# Patient Record
Sex: Female | Born: 1990 | Race: Black or African American | Hispanic: No | Marital: Single
Health system: Midwestern US, Community
[De-identification: ages and names within clinical notes are randomized; demographics above are authoritative.]

## PROBLEM LIST (undated history)

## (undated) DIAGNOSIS — N83209 Unspecified ovarian cyst, unspecified side: Secondary | ICD-10-CM

## (undated) HISTORY — PX: WISDOM TOOTH EXTRACTION: SHX21

---

## 2013-08-28 LAB — URINE MICROSCOPIC ONLY: WBC: 4 /hpf (ref 0–4)

## 2013-08-28 LAB — URINALYSIS W/ RFLX MICROSCOPIC
Bilirubin: NEGATIVE
Glucose: NEGATIVE mg/dL
Ketone: NEGATIVE mg/dL
Nitrites: NEGATIVE
Protein: NEGATIVE mg/dL
Specific gravity: 1.015 (ref 1.003–1.030)
Urobilinogen: 0.2 EU/dL (ref 0.2–1.0)
pH (UA): 6.5 (ref 5.0–8.0)

## 2013-08-28 LAB — HCG URINE, QL: HCG urine, QL: NEGATIVE

## 2013-08-28 MED ORDER — PHENAZOPYRIDINE 200 MG TAB
200 mg | ORAL_TABLET | Freq: Three times a day (TID) | ORAL | 0.00 refills | 5.00 days | Status: AC
Start: 2013-08-28 — End: 2013-08-30

## 2013-08-28 MED ORDER — TRIMETHOPRIM-SULFAMETHOXAZOLE 160 MG-800 MG TAB
160-800 mg | ORAL_TABLET | Freq: Two times a day (BID) | ORAL | Status: AC
Start: 2013-08-28 — End: 2013-08-31

## 2013-08-28 NOTE — ED Notes (Signed)
Patient armband removed and shredded I have reviewed discharge instructions with the patient.  The patient verbalized understanding.

## 2013-08-28 NOTE — ED Notes (Signed)
Lower abdominal pain with urinary frequency, pain and odor to urine

## 2013-08-28 NOTE — ED Provider Notes (Signed)
HPI Comments: Madeline Murray is a [age] y.o. female who presents to the emergency department for evaluation of possible UTI. Pt states she has had lower abdominal pain, urinary frequency and dysuria for 3 days. Pt also had chills last night. Pt states symptoms feel similar to UTI she had 4 years ago. Pt denies fever, SOB, chest pain, NVD, headache and dizziness. Pt expresses no other complaints at this time.     The history is provided by the patient.        History reviewed. No pertinent past medical history.     History reviewed. No pertinent past surgical history.      History reviewed. No pertinent family history.     History     Social History   ??? Marital Status: SINGLE     Spouse Name: N/A     Number of Children: N/A   ??? Years of Education: N/A     Occupational History   ??? Not on file.     Social History Main Topics   ??? Smoking status: Never Smoker    ??? Smokeless tobacco: Not on file   ??? Alcohol Use: Not on file   ??? Drug Use: Not on file   ??? Sexual Activity: Not on file     Other Topics Concern   ??? Not on file     Social History Narrative   ??? No narrative on file                  ALLERGIES: Review of patient's allergies indicates no known allergies.      Review of Systems   Constitutional: Positive for chills. Negative for fever.   HENT: Negative for congestion and sore throat.    Eyes: Negative for redness and visual disturbance.   Respiratory: Negative for shortness of breath.    Cardiovascular: Negative for chest pain.   Gastrointestinal: Positive for abdominal pain. Negative for nausea, vomiting and diarrhea.   Genitourinary: Positive for dysuria and frequency. Negative for difficulty urinating.   Musculoskeletal: Negative for myalgias and arthralgias.   Skin: Negative for rash.   Neurological: Negative for dizziness and headaches.   Psychiatric/Behavioral: Negative for dysphoric mood.   All other systems reviewed and are negative.      Filed Vitals:    08/28/13 1329   BP: 127/75   Pulse: 87   Temp: 98.4 ??F  (36.9 ??C)   Resp: 17   Height: 5\' 6"  (1.676 m)   Weight: 61.236 kg (135 lb)   SpO2: 100%            Physical Exam   Constitutional: She is oriented to person, place, and time. She appears well-developed and well-nourished. No distress.   HENT:   Head: Normocephalic and atraumatic.   Nose: Nose normal.   Cardiovascular: Normal rate, regular rhythm and normal heart sounds.  Exam reveals no gallop and no friction rub.    No murmur heard.  Pulmonary/Chest: Effort normal and breath sounds normal. No respiratory distress. She has no wheezes. She has no rales.   Abdominal: Soft. She exhibits no mass. There is tenderness in the suprapubic area. There is no rebound and no guarding.   Mild bilateral flank tenderness to percussion. No RUQ or RLQ tenderness.    Musculoskeletal: She exhibits no edema or tenderness.   Neurological: She is alert and oriented to person, place, and time. She exhibits normal muscle tone. Coordination normal.   Skin: Skin is warm and dry. No  rash noted. She is not diaphoretic.   Psychiatric: She has a normal mood and affect.   Nursing note and vitals reviewed.       MDM  Number of Diagnoses or Management Options  Microscopic hematuria:   UTI (urinary tract infection):   Diagnosis management comments: IMP: Pt presents with UTI sxs. Has microscopic hematuria, neg pregnancy. No localizing flank/abdominal pain to suggest rebnal colic at this time. Will treat for hem cystitis and give f/u referral.      Procedures    -------------------------------------------------------------------------------------------------------------------  PROGRESS NOTES:  1:26 PM: Karma Ganja, MD is evaluating the patient. Discussed treatment plan with the patient.  2:30 PM: Karma Ganja, MD reviewed lab results with the parent. Discussed diagnosis, prescriptions and follow up instructions with the parent. Answered parent's questions. Patient is stable and ready for discharge.       CONSULTATIONS:  None    ORDERS:  Orders  Placed This Encounter   ??? URINALYSIS W/ RFLX MICROSCOPIC   ??? HCG URINE, QL   ??? URINE MICROSCOPIC ONLY   ??? trimethoprim-sulfamethoxazole (BACTRIM DS) 160-800 mg per tablet   ??? phenazopyridine (PYRIDIUM) 200 mg tablet       MEDICATIONS:  Medications - No data to display     RADIOLOGY RESULTS:  No orders to display       LAB & EKG RESULTS:   Recent Results (from the past 12 hour(s))   URINALYSIS W/ RFLX MICROSCOPIC    Collection Time     08/28/13  1:30 PM       Result Value Ref Range    Color YELLOW      Appearance CLEAR      Specific gravity 1.015  1.003 - 1.030      pH (UA) 6.5  5.0 - 8.0      Protein NEGATIVE   NEG mg/dL    Glucose NEGATIVE   NEG mg/dL    Ketone NEGATIVE   NEG mg/dL    Bilirubin NEGATIVE   NEG      Blood LARGE (*) NEG      Urobilinogen 0.2  0.2 - 1.0 EU/dL    Nitrites NEGATIVE   NEG      Leukocyte Esterase TRACE (*) NEG     HCG URINE, QL    Collection Time     08/28/13  1:30 PM       Result Value Ref Range    HCG urine, Ql. NEGATIVE   NEG          DISPOSITION:   Diagnosis:   1. UTI (urinary tract infection)    2. Microscopic hematuria          Disposition: home    Follow-up Information    Follow up With Details Comments Contact Info    Phillis Knack, MD In 1 week As needed [address]  [address] [place] [address]  [phone]            Current Discharge Medication List      START taking these medications    Details   trimethoprim-sulfamethoxazole (BACTRIM DS) 160-800 mg per tablet Take 1 Tab by mouth two (2) times a day for 3 days.  Qty: 6 Tab, Refills: 0      phenazopyridine (PYRIDIUM) 200 mg tablet Take 1 Tab by mouth three (3) times daily for 2 days.  Qty: 6 Tab, Refills: 0  Teacher, early years/pre written by: Donell Beers (1:27 PM), scribing for and in the presence of Karma Ganja, MD, ED Provider.    PROVIDER ATTESTATION STATEMENT   I personally performed the services described in the documentation, reviewed the  documentation, as recorded by the scribe in my presence, and it accurately and completely records my words and actions.   Karma Ganja, MD. 2:35 PM    -------------------------------------------------------------------------------------------------------------------

## 2013-08-28 NOTE — ED Provider Notes (Signed)
 Formatting of this note is different from the original.  HPI Comments: Madeline Murray is a 23 y.o. female who presents to the emergency department for evaluation of possible UTI. Pt states she has had lower abdominal pain, urinary frequency and dysuria for 3 days. Pt also had chills last night. Pt states symptoms feel similar to UTI she had 4 years ago. Pt denies fever, SOB, chest pain, NVD, headache and dizziness. Pt expresses no other complaints at this time.     The history is provided by the patient.       History reviewed. No pertinent past medical history.     History reviewed. No pertinent past surgical history.      History reviewed. No pertinent family history.     History     Social History   ? Marital Status: SINGLE     Spouse Name: N/A     Number of Children: N/A   ? Years of Education: N/A     Occupational History   ? Not on file.     Social History Main Topics   ? Smoking status: Never Smoker    ? Smokeless tobacco: Not on file   ? Alcohol Use: Not on file   ? Drug Use: Not on file   ? Sexual Activity: Not on file     Other Topics Concern   ? Not on file     Social History Narrative   ? No narrative on file         ALLERGIES: Review of patient's allergies indicates no known allergies.    Review of Systems   Constitutional: Positive for chills. Negative for fever.   HENT: Negative for congestion and sore throat.    Eyes: Negative for redness and visual disturbance.   Respiratory: Negative for shortness of breath.    Cardiovascular: Negative for chest pain.   Gastrointestinal: Positive for abdominal pain. Negative for nausea, vomiting and diarrhea.   Genitourinary: Positive for dysuria and frequency. Negative for difficulty urinating.   Musculoskeletal: Negative for myalgias and arthralgias.   Skin: Negative for rash.   Neurological: Negative for dizziness and headaches.   Psychiatric/Behavioral: Negative for dysphoric mood.   All other systems reviewed and are negative.    Filed Vitals:    08/28/13  1329   BP: 127/75   Pulse: 87   Temp: 98.4 F (36.9 C)   Resp: 17   Height: 5' 6 (1.676 m)   Weight: 61.236 kg (135 lb)   SpO2: 100%       Physical Exam   Constitutional: She is oriented to person, place, and time. She appears well-developed and well-nourished. No distress.   HENT:   Head: Normocephalic and atraumatic.   Nose: Nose normal.   Cardiovascular: Normal rate, regular rhythm and normal heart sounds.  Exam reveals no gallop and no friction rub.    No murmur heard.  Pulmonary/Chest: Effort normal and breath sounds normal. No respiratory distress. She has no wheezes. She has no rales.   Abdominal: Soft. She exhibits no mass. There is tenderness in the suprapubic area. There is no rebound and no guarding.   Mild bilateral flank tenderness to percussion. No RUQ or RLQ tenderness.    Musculoskeletal: She exhibits no edema or tenderness.   Neurological: She is alert and oriented to person, place, and time. She exhibits normal muscle tone. Coordination normal.   Skin: Skin is warm and dry. No rash noted. She is not diaphoretic.   Psychiatric:  She has a normal mood and affect.   Nursing note and vitals reviewed.      MDM  Number of Diagnoses or Management Options  Microscopic hematuria:   UTI (urinary tract infection):   Diagnosis management comments: IMP: Pt presents with UTI sxs. Has microscopic hematuria, neg pregnancy. No localizing flank/abdominal pain to suggest rebnal colic at this time. Will treat for hem cystitis and give f/u referral.    Procedures    -------------------------------------------------------------------------------------------------------------------  PROGRESS NOTES:  1:26 PM: Lamar DELENA Freud, MD is evaluating the patient. Discussed treatment plan with the patient.  2:30 PM: Lamar DELENA Freud, MD reviewed lab results with the parent. Discussed diagnosis, prescriptions and follow up instructions with the parent. Answered parent's questions. Patient is stable and ready for discharge.        CONSULTATIONS:  None    ORDERS:  Orders Placed This Encounter   ? URINALYSIS W/ RFLX MICROSCOPIC   ? HCG URINE, QL   ? URINE MICROSCOPIC ONLY   ? trimethoprim-sulfamethoxazole (BACTRIM DS) 160-800 mg per tablet   ? phenazopyridine (PYRIDIUM) 200 mg tablet     MEDICATIONS:  Medications - No data to display     RADIOLOGY RESULTS:  No orders to display     LAB & EKG RESULTS:   Recent Results (from the past 12 hour(s))   URINALYSIS W/ RFLX MICROSCOPIC    Collection Time     08/28/13  1:30 PM       Result Value Ref Range    Color YELLOW      Appearance CLEAR      Specific gravity 1.015  1.003 - 1.030      pH (UA) 6.5  5.0 - 8.0      Protein NEGATIVE   NEG mg/dL    Glucose NEGATIVE   NEG mg/dL    Ketone NEGATIVE   NEG mg/dL    Bilirubin NEGATIVE   NEG      Blood LARGE (*) NEG      Urobilinogen 0.2  0.2 - 1.0 EU/dL    Nitrites NEGATIVE   NEG      Leukocyte Esterase TRACE (*) NEG     HCG URINE, QL    Collection Time     08/28/13  1:30 PM       Result Value Ref Range    HCG urine, Ql. NEGATIVE   NEG         DISPOSITION:   Diagnosis:   1. UTI (urinary tract infection)    2. Microscopic hematuria      Disposition: home    Follow-up Information    Follow up With Details Comments Contact Info    Jenetta JONELLE Roller, MD In 1 week As needed [address]  [address] [place] [address]  [phone]        Current Discharge Medication List     START taking these medications    Details   trimethoprim-sulfamethoxazole (BACTRIM DS) 160-800 mg per tablet Take 1 Tab by mouth two (2) times a day for 3 days.  Qty: 6 Tab, Refills: 0     phenazopyridine (PYRIDIUM) 200 mg tablet Take 1 Tab by mouth three (3) times daily for 2 days.  Qty: 6 Tab, Refills: 0           Teacher, early years/pre written by: Cassandra Dalmida (1:27 PM), scribing for and in the presence of Lamar DELENA Freud, MD, ED Provider.  PROVIDER ATTESTATION STATEMENT   I personally performed the services described in the  documentation, reviewed the documentation, as recorded by the scribe in my presence, and it accurately and completely records my words and actions.   Lamar DELENA Freud, MD. 2:35 PM    -------------------------------------------------------------------------------------------------------------------   Electronically signed by Freud Lamar DELENA, MD at 08/28/2013  2:35 PM EDT

## 2013-08-28 NOTE — ED Notes (Signed)
 Formatting of this note might be different from the original.  Lower abdominal pain with urinary frequency, pain and odor to urine  Electronically signed by Trena Kate SQUIBB, RN at 08/28/2013  1:28 PM EDT

## 2013-08-28 NOTE — ED Notes (Signed)
 Formatting of this note might be different from the original.  Patient armband removed and shredded I have reviewed discharge instructions with the patient.  The patient verbalized understanding.  Electronically signed by Wadie Andree GAILS, RN at 08/28/2013  2:47 PM EDT

## 2017-03-31 ENCOUNTER — Emergency Department (HOSPITAL_COMMUNITY)
Admission: EM | Admit: 2017-03-31 | Discharge: 2017-03-31 | Disposition: A | Discharge status: Home / Self Care | Payer: Self-pay | Attending: Emergency Medicine | Admitting: Emergency Medicine

## 2017-03-31 ENCOUNTER — Encounter (HOSPITAL_COMMUNITY): Payer: Self-pay | Admitting: Emergency Medicine

## 2017-03-31 DIAGNOSIS — B9689 Other specified bacterial agents as the cause of diseases classified elsewhere: Secondary | ICD-10-CM | POA: Insufficient documentation

## 2017-03-31 DIAGNOSIS — N760 Acute vaginitis: Secondary | ICD-10-CM | POA: Insufficient documentation

## 2017-03-31 DIAGNOSIS — N72 Inflammatory disease of cervix uteri: Secondary | ICD-10-CM | POA: Insufficient documentation

## 2017-03-31 LAB — URINALYSIS, ROUTINE W REFLEX MICROSCOPIC
Bilirubin Urine: NEGATIVE
Glucose, UA: NEGATIVE mg/dL
HGB URINE DIPSTICK: NEGATIVE
Ketones, ur: NEGATIVE mg/dL
LEUKOCYTES UA: NEGATIVE
Nitrite: NEGATIVE
PROTEIN: NEGATIVE mg/dL
Specific Gravity, Urine: 1.025 (ref 1.005–1.030)
pH: 7 (ref 5.0–8.0)

## 2017-03-31 LAB — BASIC METABOLIC PANEL
Anion gap: 7 (ref 5–15)
BUN: 12 mg/dL (ref 6–20)
CALCIUM: 9.2 mg/dL (ref 8.9–10.3)
CO2: 26 mmol/L (ref 22–32)
CREATININE: 0.95 mg/dL (ref 0.44–1.00)
Chloride: 105 mmol/L (ref 101–111)
GFR calc non Af Amer: >60 mL/min (ref >60)
Glucose, Bld: 101 mg/dL — ABNORMAL HIGH (ref 65–99)
Potassium: 4.1 mmol/L (ref 3.5–5.1)
Sodium: 138 mmol/L (ref 135–145)

## 2017-03-31 LAB — CBC
HCT: 39.5 % (ref 36.0–46.0)
Hemoglobin: 13.7 g/dL (ref 12.0–15.0)
MCH: 30.2 pg (ref 26.0–34.0)
MCHC: 34.7 g/dL (ref 30.0–36.0)
MCV: 87.2 fL (ref 78.0–100.0)
PLATELETS: 314 10*3/uL (ref 150–400)
RBC: 4.53 MIL/uL (ref 3.87–5.11)
RDW: 12.3 % (ref 11.5–15.5)
WBC: 6 10*3/uL (ref 4.0–10.5)

## 2017-03-31 LAB — RPR: RPR: NONREACTIVE

## 2017-03-31 LAB — HIV ANTIBODY (ROUTINE TESTING W REFLEX): HIV Screen 4th Generation wRfx: NONREACTIVE

## 2017-03-31 LAB — I-STAT BETA HCG BLOOD, ED (MC, WL, AP ONLY)

## 2017-03-31 LAB — WET PREP, GENITAL
Sperm: NONE SEEN
TRICH WET PREP: NONE SEEN
Yeast Wet Prep HPF POC: NONE SEEN

## 2017-03-31 MED ORDER — DOXYCYCLINE HYCLATE 100 MG PO CAPS
100.0000 mg | ORAL_CAPSULE | Freq: Two times a day (BID) | ORAL | 0 refills | Status: AC
Start: 2017-03-31 — End: 2017-04-14

## 2017-03-31 MED ORDER — CEFTRIAXONE SODIUM 250 MG IJ SOLR
250.0000 mg | Freq: Once | INTRAMUSCULAR | Status: DC
  Filled 2017-03-31: qty 250

## 2017-03-31 MED ORDER — MORPHINE SULFATE (PF) 4 MG/ML IV SOLN: 4.0000 mg | Freq: Once | INTRAVENOUS | Status: DC

## 2017-03-31 MED ORDER — AZITHROMYCIN 250 MG PO TABS
1000.0000 mg | ORAL_TABLET | Freq: Once | ORAL | Status: DC
  Filled 2017-03-31: qty 4

## 2017-03-31 MED ORDER — ACETAMINOPHEN 500 MG PO TABS
1000.0000 mg | ORAL_TABLET | Freq: Once | ORAL | Status: DC
  Filled 2017-03-31: qty 2

## 2017-03-31 MED ORDER — IBUPROFEN 800 MG PO TABS
800.0000 mg | ORAL_TABLET | Freq: Once | ORAL | Status: DC
  Filled 2017-03-31: qty 1

## 2017-03-31 MED ORDER — METRONIDAZOLE 500 MG PO TABS: 500.0000 mg | ORAL_TABLET | Freq: Two times a day (BID) | ORAL | 0 refills | Status: DC

## 2017-03-31 MED ORDER — ONDANSETRON HCL 4 MG/2ML IJ SOLN: 4.0000 mg | Freq: Once | INTRAMUSCULAR | Status: DC

## 2017-03-31 MED ORDER — LIDOCAINE HCL (PF) 1 % IJ SOLN
INTRAMUSCULAR | Status: AC
  Filled 2017-03-31: qty 5

## 2017-03-31 NOTE — ED Provider Notes (Signed)
[facility]-EMERGENCY DEPT Provider Note   CSN: [id number] Arrival date & time: 03/31/17  4098     History   Chief Complaint Chief Complaint  Patient presents with  . Abdominal Pain    HPI Paige Mccarty is a 26 y.o. female.  26 yo F with a chief complaint of left lower abdominal pain. The started this morning. As she is waiting in triage she noticed a lump. Worse with movement palpation describes the pain is sharp. Denies nausea vomiting or diarrhea. Denies sick contacts. Denies vaginal bleeding or discharge. Denies trauma. Denies radiation of the pain. Denies prior abdominal surgery.   The history is provided by the patient.  Abdominal Pain   This is a new problem. The current episode started 6 to 12 hours ago. The problem occurs constantly. The problem has not changed since onset.The pain is located in the LLQ. The quality of the pain is sharp and shooting. The pain is at a severity of 4/10. The pain is severe. Pertinent negatives include fever, nausea, vomiting, dysuria, headaches, arthralgias and myalgias. The symptoms are aggravated by certain positions and palpation. Nothing relieves the symptoms.    History reviewed. No pertinent past medical history.  There are no active problems to display for this patient.   History reviewed. No pertinent surgical history.  OB History    No data available       Home Medications    Prior to Admission medications   Medication Sig Start Date End Date Taking? Authorizing Provider  naproxen sodium (ANAPROX) 220 MG tablet Take 220 mg by mouth once.   Yes [provider]  doxycycline (VIBRAMYCIN) 100 MG capsule Take 1 capsule (100 mg total) by mouth 2 (two) times daily. 03/31/17 04/14/17  Melene Plan, DO  metroNIDAZOLE (FLAGYL) 500 MG tablet Take 1 tablet (500 mg total) by mouth 2 (two) times daily. 03/31/17   Melene Plan, DO    Family History No family history on file.  Social History Social History  Substance Use  Topics  . Smoking status: Never Smoker  . Smokeless tobacco: Never Used  . Alcohol use Not on file     Allergies   Patient has no known allergies.   Review of Systems Review of Systems  Constitutional: Negative for chills and fever.  HENT: Negative for congestion and rhinorrhea.   Eyes: Negative for redness and visual disturbance.  Respiratory: Negative for shortness of breath and wheezing.   Cardiovascular: Negative for chest pain and palpitations.  Gastrointestinal: Positive for abdominal pain. Negative for nausea and vomiting.  Genitourinary: Negative for dysuria and urgency.  Musculoskeletal: Negative for arthralgias and myalgias.  Skin: Negative for pallor and wound.  Neurological: Negative for dizziness and headaches.     Physical Exam Updated Vital Signs BP 116/64 (BP Location: Right Arm)   Pulse 90   Temp 98.8 F (37.1 C) (Oral)   Resp 16   Ht  (1.6 m)   Wt 60.8 kg (134 lb)   LMP 03/17/2017 (Approximate)   SpO2 100%   BMI 23.74 kg/m   Physical Exam  Constitutional: She is oriented to person, place, and time. She appears well-developed and well-nourished. No distress.  HENT:  Head: Normocephalic and atraumatic.  Eyes: Pupils are equal, round, and reactive to light. EOM are normal.  Neck: Normal range of motion. Neck supple.  Cardiovascular: Normal rate and regular rhythm.  Exam reveals no gallop and no friction rub.   No murmur heard. Pulmonary/Chest: Effort normal. She has  no wheezes. She has no rales.  Abdominal: Soft. She exhibits no distension and no mass. There is generalized tenderness and tenderness in the suprapubic area. There is no guarding.  Small punctate area of tenderness just left of midline in the suprapubic region  Genitourinary: Cervix exhibits motion tenderness, discharge and friability. Right adnexum displays no mass, no tenderness and no fullness. Left adnexum displays no mass, no tenderness and no fullness.  Musculoskeletal: She  exhibits no edema or tenderness.  Neurological: She is alert and oriented to person, place, and time.  Skin: Skin is warm and dry. She is not diaphoretic.  Psychiatric: Her behavior is normal. Her mood appears anxious.  Nursing note and vitals reviewed.    ED Treatments / Results  Labs (all labs ordered are listed, but only abnormal results are displayed) Labs Reviewed  WET PREP, GENITAL - Abnormal; Notable for the following:       Result Value   Clue Cells Wet Prep HPF POC PRESENT (*)    WBC, Wet Prep HPF POC MANY (*)    All other components within normal limits  BASIC METABOLIC PANEL - Abnormal; Notable for the following:    Glucose, Bld 101 (*)    All other components within normal limits  URINALYSIS, ROUTINE W REFLEX MICROSCOPIC  CBC  RPR  HIV ANTIBODY (ROUTINE TESTING)  I-STAT BETA HCG BLOOD, ED ([facility], [facility], [facility] ONLY)  GC/CHLAMYDIA PROBE AMP ([place]) NOT AT [facility]    EKG  EKG Interpretation None       Radiology No results found.  Procedures Procedures (including critical care time)  Medications Ordered in ED Medications  cefTRIAXone (ROCEPHIN) injection 250 mg (250 mg Intramuscular Refused 03/31/17 1107)  azithromycin (ZITHROMAX) tablet 1,000 mg (1,000 mg Oral Refused 03/31/17 1107)  acetaminophen (TYLENOL) tablet 1,000 mg (1,000 mg Oral Refused 03/31/17 1108)  ibuprofen (ADVIL,MOTRIN) tablet 800 mg (800 mg Oral Refused 03/31/17 1108)     Initial Impression / Assessment and Plan / ED Course  I have reviewed the triage vital signs and the nursing notes.  Pertinent labs & imaging results that were available during my care of the patient were reviewed by me and considered in my medical decision making (see chart for details).     26 yo F With a chief complaint of lower abdominal pain. Just started this morning. Patient complaining of a lump that is almost like a lymph node inconsistency though more midline. Pelvic with significant CMT and drainage.  No adnexal  TTP.   Will treat for PID.  Awaiting wet prep.   The patient is a bit hesitant to start her therapy prior to having a confirmed diagnosis. I discussed the limitations of the oral therapy and that it would be preferred be treated with IM medication. She understands this risk and the risk that she may become infertile or obtain an abscess for an untreated infection. I'll have her follow-up with the family physician.  11:38 AM:  I have discussed the diagnosis/risks/treatment options with the patient and believe the pt to be eligible for discharge home to follow-up with PCP, GYN. We also discussed returning to the ED immediately if new or worsening sx occur. We discussed the sx which are most concerning (e.g., sudden worsening pain, fever, inability to tolerate by mouth) that necessitate immediate return. Medications administered to the patient during their visit and any new prescriptions provided to the patient are listed below.  Medications given during this visit Medications  cefTRIAXone (ROCEPHIN) injection 250  mg (250 mg Intramuscular Refused 03/31/17 1107)  azithromycin (ZITHROMAX) tablet 1,000 mg (1,000 mg Oral Refused 03/31/17 1107)  acetaminophen (TYLENOL) tablet 1,000 mg (1,000 mg Oral Refused 03/31/17 1108)  ibuprofen (ADVIL,MOTRIN) tablet 800 mg (800 mg Oral Refused 03/31/17 1108)     The patient appears reasonably screen and/or stabilized for discharge and I doubt any other medical condition or other [facility] requiring further screening, evaluation, or treatment in the ED at this time prior to discharge.    Final Clinical Impressions(s) / ED Diagnoses   Final diagnoses:  BV (bacterial vaginosis)  Cervicitis    New Prescriptions New Prescriptions   DOXYCYCLINE (VIBRAMYCIN) 100 MG CAPSULE    Take 1 capsule (100 mg total) by mouth 2 (two) times daily.   METRONIDAZOLE (FLAGYL) 500 MG TABLET    Take 1 tablet (500 mg total) by mouth 2 (two) times daily.     Melene Plan, DO 03/31/17  1139

## 2017-03-31 NOTE — ED Triage Notes (Signed)
Pt arrives to ED from home with complaints of left flank pain originating in the front and radiating to the back since 3-4 days ago, worsening today. Pt states she took Aleve for pain yesterday "but it wore off because today I feel like crap". PT denies frequent or burning with urination, denies vomiting, but is nauseous.

## 2017-03-31 NOTE — ED Notes (Signed)
Patient unable to sign for discharge at this time; pt verbalizes understanding of discharge instructions. Opportunity for questioning and answers were provided.  

## 2017-03-31 NOTE — ED Notes (Signed)
Spoke with patient for about 15 minutes explaining importance of medications for pelvic infection as described to her by the provider.  The patient refused any medications at this time and wants to wait till the results from the pelvic come back

## 2017-03-31 NOTE — ED Notes (Signed)
pt c/o pinpoint llq pain since tuesday. states he noticed pain but thought she needed to stretch. this am pain worse and with mild nausea. pt states after changing into a gown she has noticed a small 'knot' in her abd. small (grape size) lump noted with palpation. Pt very anxious. Denies pain with urination. Denies vomiting or diarrhea. Denies movement of pain.

## 2017-03-31 NOTE — Discharge Instructions (Signed)
Take 4 over the counter ibuprofen tablets 3 times a day or 2 over-the-counter naproxen tablets twice a day for pain. Also take tylenol (2 extra strength) four times a day.   You have BV, this sometimes can give you some pelvic pain.  Metronidazole is the treatment for that.  Doxycycline is the antibiotic for your infection.  No sexual contact until the medication is completed.

## 2017-04-01 LAB — GC/CHLAMYDIA PROBE AMP (~~LOC~~) NOT AT ARMC
CHLAMYDIA, DNA PROBE: NEGATIVE
NEISSERIA GONORRHEA: NEGATIVE

## 2017-06-02 ENCOUNTER — Encounter: Payer: Self-pay | Admitting: Obstetrics and Gynecology

## 2017-06-02 ENCOUNTER — Encounter (HOSPITAL_COMMUNITY): Payer: Self-pay

## 2017-06-02 ENCOUNTER — Inpatient Hospital Stay (HOSPITAL_COMMUNITY)

## 2017-06-02 ENCOUNTER — Inpatient Hospital Stay (HOSPITAL_COMMUNITY)
Admission: AD | Admit: 2017-06-02 | Discharge: 2017-06-02 | Disposition: A | Discharge status: Home / Self Care | Source: Ambulatory Visit | Attending: Obstetrics and Gynecology | Admitting: Obstetrics and Gynecology

## 2017-06-02 DIAGNOSIS — R102 Pelvic and perineal pain unspecified side: Secondary | ICD-10-CM | POA: Diagnosis present

## 2017-06-02 DIAGNOSIS — B9689 Other specified bacterial agents as the cause of diseases classified elsewhere: Secondary | ICD-10-CM | POA: Diagnosis present

## 2017-06-02 DIAGNOSIS — N83202 Unspecified ovarian cyst, left side: Secondary | ICD-10-CM | POA: Diagnosis not present

## 2017-06-02 DIAGNOSIS — Z3202 Encounter for pregnancy test, result negative: Secondary | ICD-10-CM | POA: Insufficient documentation

## 2017-06-02 DIAGNOSIS — N760 Acute vaginitis: Secondary | ICD-10-CM | POA: Insufficient documentation

## 2017-06-02 DIAGNOSIS — R110 Nausea: Secondary | ICD-10-CM | POA: Diagnosis present

## 2017-06-02 DIAGNOSIS — N898 Other specified noninflammatory disorders of vagina: Secondary | ICD-10-CM | POA: Diagnosis present

## 2017-06-02 LAB — WET PREP, GENITAL
Sperm: NONE SEEN
TRICH WET PREP: NONE SEEN
Yeast Wet Prep HPF POC: NONE SEEN

## 2017-06-02 LAB — URINALYSIS, ROUTINE W REFLEX MICROSCOPIC
Bilirubin Urine: NEGATIVE
GLUCOSE, UA: NEGATIVE mg/dL
HGB URINE DIPSTICK: NEGATIVE
Ketones, ur: NEGATIVE mg/dL
LEUKOCYTES UA: NEGATIVE
Nitrite: NEGATIVE
PH: 7 (ref 5.0–8.0)
PROTEIN: NEGATIVE mg/dL
SPECIFIC GRAVITY, URINE: 1.012 (ref 1.005–1.030)

## 2017-06-02 LAB — POCT PREGNANCY, URINE: Preg Test, Ur: NEGATIVE

## 2017-06-02 MED ORDER — METOCLOPRAMIDE HCL 10 MG PO TABS: 10.0000 mg | ORAL_TABLET | Freq: Four times a day (QID) | ORAL | 0 refills | Status: DC

## 2017-06-02 MED ORDER — TRAMADOL HCL 50 MG PO TABS: 50.0000 mg | ORAL_TABLET | Freq: Four times a day (QID) | ORAL | 0 refills | Status: DC | PRN

## 2017-06-02 MED ORDER — METRONIDAZOLE 500 MG PO TABS: 500.0000 mg | ORAL_TABLET | Freq: Two times a day (BID) | ORAL | 0 refills | Status: AC

## 2017-06-02 NOTE — MAU Provider Note (Signed)
History     CSN: [id number]  Arrival date and time: 06/02/17 1244   First Provider Initiated Contact with Patient 06/02/17 1702      Chief Complaint  Patient presents with  . Vaginal Discharge   HPI  Ms. Carmin RichmondKenyatta Denise Brim is a 26 y.o. G0 non-pregnant female presenting to MAU with complaints of pelvic pain in LT loer area x 4 months; now on RT lower pelvic area x 2 months. She was seen at an Urgent Care 2 months ago, had an u/s and was told she had an ovarian cyst. She is concerned after looking on [employer] that she may have something worse going on with her ovary. She reports she having a "lump in the lower LT portion of her pelvis. She reports being able to palpate it and the pain increases with palpation.  History reviewed. No pertinent past medical history.  History reviewed. No pertinent surgical history.  History reviewed. No pertinent family history.  Social History   Tobacco Use  . Smoking status: Never Smoker  . Smokeless tobacco: Never Used  Substance Use Topics  . Alcohol use: No    Frequency: Never  . Drug use: No    Allergies: No Known Allergies  No medications prior to admission.    Review of Systems  Constitutional: Negative.   HENT: Negative.   Eyes: Negative.   Respiratory: Negative.   Cardiovascular: Negative.   Gastrointestinal: Positive for abdominal pain and nausea.  Endocrine: Negative.   Genitourinary: Positive for pelvic pain (lower pelvis).  Musculoskeletal: Negative.   Skin: Negative.   Allergic/Immunologic: Negative.   Neurological: Negative.   Hematological: Negative.   Psychiatric/Behavioral: Negative.    Physical Exam   Blood pressure 103/71, pulse (!) 103, temperature 98.3 F (36.8 C), resp. rate 18, height 5\' 4"  (1.626 m), weight 137 lb (62.1 kg), last menstrual period 05/18/2017.  Physical Exam  Nursing note and vitals reviewed. Constitutional: She is oriented to person, place, and time. She appears well-developed and  well-nourished.  HENT:  Head: Normocephalic.  Eyes: Pupils are equal, round, and reactive to light.  Neck: Normal range of motion.  Cardiovascular: Normal rate, regular rhythm and normal heart sounds.  Respiratory: Effort normal and breath sounds normal.  GI: Soft. Bowel sounds are normal.  Musculoskeletal: Normal range of motion.  Neurological: She is alert and oriented to person, place, and time.  Skin: Skin is warm and dry.  Psychiatric: She has a normal mood and affect. Her behavior is normal. Judgment and thought content normal.    MAU Course  Procedures  MDM CCUA UPT Pelvis complete U/S  Results for orders placed or performed during the hospital encounter of 06/02/17 (from the past 24 hour(s))  Urinalysis, Routine w reflex microscopic     Status: Abnormal   Collection Time: 06/02/17  2:01 PM  Result Value Ref Range   Color, Urine STRAW (A) YELLOW   APPearance HAZY (A) CLEAR   Specific Gravity, Urine 1.012 1.005 - 1.030   pH 7.0 5.0 - 8.0   Glucose, UA NEGATIVE NEGATIVE mg/dL   Hgb urine dipstick NEGATIVE NEGATIVE   Bilirubin Urine NEGATIVE NEGATIVE   Ketones, ur NEGATIVE NEGATIVE mg/dL   Protein, ur NEGATIVE NEGATIVE mg/dL   Nitrite NEGATIVE NEGATIVE   Leukocytes, UA NEGATIVE NEGATIVE  Pregnancy, urine POC     Status: None   Collection Time: 06/02/17  2:36 PM  Result Value Ref Range   Preg Test, Ur NEGATIVE NEGATIVE  Wet prep, genital  Status: Abnormal   Collection Time: 06/02/17  4:51 PM  Result Value Ref Range   Yeast Wet Prep HPF POC NONE SEEN NONE SEEN   Trich, Wet Prep NONE SEEN NONE SEEN   Clue Cells Wet Prep HPF POC PRESENT (A) NONE SEEN   WBC, Wet Prep HPF POC MODERATE (A) NONE SEEN   Sperm NONE SEEN     U/S Pelvis (transabdominal Only)  Result Date: 06/02/2017 CLINICAL DATA:  Chronic pelvic pain with lump in the left lower quadrant EXAM: ULTRASOUND PELVIS TRANSVAGINAL TECHNIQUE: Transvaginal ultrasound examination of the pelvis was performed  including evaluation of the uterus, ovaries, adnexal regions, and pelvic cul-de-sac. COMPARISON:  None. FINDINGS: Uterus Measurements: 9.2 x 3.5 x 5.7 cm. No fibroids or other mass visualized. Endometrium Thickness: 9-10 mm.  No focal abnormality visualized. Right ovary Measurements: 4.9 x 3.0 x 3.9 cm. Normal appearance/no adnexal mass. Left ovary Large cystic mass identified left adnexal space measuring 10.3 x 7.5 x 10.4 cm. The lesion has low level internal echoes without evidence for septation or mural nodularity. This is likely ovarian and origin. Other findings:  No abnormal free fluid IMPRESSION: 10 cm mildly complex cystic lesion left adnexal space, likely ovarian origin. Internal low level echoes are more pronounced than typically seen for a simple cyst and there is some minimal wall irregularity. Internal echoes are less prominent than typical for endometrioma. No internal reticulation as typically seen for hemorrhagic cyst. Given this appearance, the lesion is considered indeterminate but probably benign. Follow-up ultrasound in 6 weeks recommended to ensure resolution. This recommendation follows the consensus statement: Management of Asymptomatic Ovarian and Other Adnexal Cysts Imaged at US: Society of Radiologists in Ultrasound Consensus Conference Statement. Radiology 2010; [id number]. Electronically Signed   By: Kennith CenterEric  Mansell M.D.   On: 06/02/2017 18:23    Assessment and Plan  Left ovarian cyst  - Plan: US PELVIS LIMITED (TRANSABDOMINAL ONLY) - F/U appt with [facility] MD -- note sent to admin pool to schedule  Pelvic pain  - Rx for Ultram 50 mg every 6 hours prn pain - Information provided on pelvic pain   Nausea - Rx for Reglan 10 mg every 6 hrs prn nausea  Bacterial Vaginitis - Rx for Flagyl 500 mg BID x 7 days - Notified by phone of BV dx -- offered educational material; declined, but will p/u Rx and take as prescribed  Discharge home Patient verbalized an understanding of the  plan of care and agrees.   Raelyn Moraolitta Champion Corales, MSN, CNM 06/02/2017, 5:04 PM

## 2017-06-02 NOTE — MAU Note (Signed)
Pt reports she is having pelvic pain. Stated she was here a few months ago . Told she had a swollen lymph noid. Still having pelvic pain now and feel a lump in her LLQ.

## 2017-06-03 ENCOUNTER — Encounter: Payer: Self-pay | Admitting: Certified Nurse Midwife

## 2017-06-03 LAB — GC/CHLAMYDIA PROBE AMP (~~LOC~~) NOT AT ARMC
CHLAMYDIA, DNA PROBE: NEGATIVE
Neisseria Gonorrhea: NEGATIVE

## 2017-07-14 ENCOUNTER — Ambulatory Visit (HOSPITAL_COMMUNITY)
Admission: RE | Admit: 2017-07-14 | Discharge: 2017-07-14 | Disposition: A | Discharge status: Home / Self Care | Source: Ambulatory Visit | Attending: Obstetrics and Gynecology | Admitting: Obstetrics and Gynecology

## 2017-07-14 ENCOUNTER — Other Ambulatory Visit (HOSPITAL_COMMUNITY): Payer: Self-pay | Admitting: Obstetrics and Gynecology

## 2017-07-14 DIAGNOSIS — R58 Hemorrhage, not elsewhere classified: Secondary | ICD-10-CM | POA: Insufficient documentation

## 2017-07-14 DIAGNOSIS — N83202 Unspecified ovarian cyst, left side: Secondary | ICD-10-CM | POA: Diagnosis present

## 2017-07-18 ENCOUNTER — Encounter: Payer: Self-pay | Admitting: Obstetrics and Gynecology

## 2017-07-18 ENCOUNTER — Ambulatory Visit (INDEPENDENT_AMBULATORY_CARE_PROVIDER_SITE_OTHER): Admitting: Obstetrics and Gynecology

## 2017-07-18 VITALS — BP 109/63 | HR 89 | Wt 126.3 lb

## 2017-07-18 DIAGNOSIS — N83202 Unspecified ovarian cyst, left side: Secondary | ICD-10-CM

## 2017-07-18 NOTE — Progress Notes (Signed)
Obstetrics and Gynecology New Patient Evaluation  Appointment Date: 07/18/2017  OBGYN Clinic: [facility]  Primary Care Provider: System, Pcp Not In  Chief Complaint: complex LO cyst  History of Present Illness: Paige Mccarty is a 27 y.o. African-American G0 (Patient's last menstrual period was 07/12/2017 (exact date).), seen for the above chief complaint. Her past medical history is significant for nothing  Presented to ED mid October with sharp LLQ pain. No imaging done and tentatively dx with PID. Pt didn't think so so declined meds and called few days later and STI labs were negative. No imaging done. Pt presented mid December to mau b/c s/s didn't go away and u/s showed complex 10cm LO cyst and set up for f/u with [place]. Pt states discomfort is kinda vague and mid to left sided. Repeat u/s a few days still showed it and stable at 10cm   Review of Systems: as noted in the History of Present Illness.  Past Medical History:  History reviewed. No pertinent past medical history.  Past Surgical History:  History reviewed. No pertinent surgical history.  Past Obstetrical History:  OB History  Gravida Para Term Preterm AB Living  0 0 0 0 0 0  SAB TAB Ectopic Multiple Live Births  0 0 0 0 0        Past Gynecological History: As per HPI. Periods: qmonth 4-7d, not particularly heavy or painful History of Pap Smear(s): Yes.   Last pap 2018, which was negative per patient History of STI(s): No. She is currently using nothing for contraception or cycle control  Social History:  Social History   Socioeconomic History  . Marital status: Single    Spouse name: Not on file  . Number of children: Not on file  . Years of education: Not on file  . Highest education level: Not on file  Social Needs  . Financial resource strain: Not on file  . Food insecurity - worry: Not on file  . Food insecurity - inability: Not on file  .  Transportation needs - medical: Not on file  . Transportation needs - non-medical: Not on file  Occupational History  . Not on file  Tobacco Use  . Smoking status: Never Smoker  . Smokeless tobacco: Never Used  Substance and Sexual Activity  . Alcohol use: No    Frequency: Never  . Drug use: No  . Sexual activity: Yes  Other Topics Concern  . Not on file  Social History Narrative  . Not on file  Patient is in the [employer]   Family History: History reviewed. No pertinent family history.   Medications Cesilia D. Ballowe had no medications administered during this visit. Current Outpatient Medications  Medication Sig Dispense Refill  . metoCLOPramide (REGLAN) 10 MG tablet Take 1 tablet (10 mg total) by mouth every 6 (six) hours. (Patient not taking: Reported on 07/18/2017) 30 tablet 0  . naproxen sodium (ANAPROX) 220 MG tablet Take 220 mg by mouth once.    . traMADol (ULTRAM) 50 MG tablet Take 1 tablet (50 mg total) by mouth every 6 (six) hours as needed for moderate pain or severe pain. (Patient not taking: Reported on 07/18/2017) 20 tablet 0   No current facility-administered medications for this visit.     Allergies Patient has no known allergies.   Physical Exam:  BP 109/63   Pulse 89   Wt 126 lb 4.8 oz (57.3 kg)   LMP 07/12/2017 (Exact Date)  BMI 21.68 kg/m  Body mass index is 21.68 kg/m. General appearance: Well nourished, well developed female in no acute distress.  Neck:  Supple, normal appearance, and no thyromegaly  Cardiovascular: normal s1 and s2.  No murmurs, rubs or gallops. Respiratory:  Clear to auscultation bilateral. Normal respiratory effort Abdomen: positive bowel sounds and no masses, hernias; diffusely non tender to palpation, non distended Neuro/Psych:  Normal mood and affect.  Skin:  Warm and dry.  Lymphatic:  No inguinal lymphadenopathy.   Pelvic exam: is not limited by body habitus EGBUS: within normal limits, Vagina: within normal limits and with  no blood or discharge in the vault, Cervix: normal appearing cervix without tenderness, discharge or lesions. Uterus:  nonenlarged and non tender and Adnexa:  right side normal. mildly ttp fullness in mid to left side of pelvis Rectovaginal: deferred  Laboratory: reviewed  Radiology:  CLINICAL DATA:  Left ovarian cyst  EXAM: TRANSABDOMINAL ULTRASOUND OF PELVIS  TECHNIQUE: Transabdominal ultrasound examination of the pelvis was performed including evaluation of the uterus, ovaries, adnexal regions, and pelvic cul-de-sac.  COMPARISON:  06/02/2017  FINDINGS: Uterus  Measurements: 8.3 x 3.1 x 5.4 cm. No fibroids or other mass visualized.  Endometrium  Thickness: 6 mm.  No focal abnormality visualized.  Right ovary  Not discretely visualized.  No adnexal mass is seen.  Left ovary  Measurements: 9.9 x 8.9 x 10.6 cm. 8.3 x 8.7 x 10.1 cm complex/hemorrhagic cystic lesion, grossly unchanged, previously 10.3 x 7.5 x 10.4 cm. No solid component/mural nodularity.  Other findings:  No abnormal free fluid.  IMPRESSION: 10.1 cm complex/hemorrhagic left ovarian cystic lesion, favoring an endometrioma. Physiologic hemorrhagic cyst is considered unlikely given lack of interval change. Given size, OB-GYN consultation is suggested for possible surgical consideration.   Electronically Signed   By: Charline BillsSriyesh  Krishnan M.D.   On: 07/14/2017 08:42   Assessment: pt stable  Plan:  1. Left ovarian cyst Reviewed with her findings and even though low risk for malignancy, I do recommend removal given that it's complex, it's size and it's persistent, which she is amenable to. Will try l/s approach and cystectomy but risk of open procedure and possible removal of ovary d/w pt. Will get CA125 to further triage pt. Request sent to scheduling. D/w her that can talk more post op but I don't believe she has to be on something for cyst suppression after surgery, if able to preserve  her other ovary.  - CA 125  RTC post op  Cornelia Copaharlie Jevonte Clanton, Jr MD Attending Center [facility](Faculty Practice)

## 2017-07-19 LAB — CA 125: CANCER ANTIGEN (CA) 125: 97 U/mL — AB (ref 0.0–38.1)

## 2017-07-21 ENCOUNTER — Telehealth: Payer: Self-pay | Admitting: Obstetrics and Gynecology

## 2017-07-21 NOTE — Telephone Encounter (Signed)
GYN Telephone Note  Patient called at [phone] and d/w her normal CA 125 for her age (<200). I told her if she hasn't received a call by Thursday, re: surgery scheduling to call us at the office.   Cornelia Copaharlie Nida Manfredi, Jr MD Attending [facility] (Faculty Practice) 07/21/2017 Time: [phone]

## 2017-07-22 ENCOUNTER — Encounter (HOSPITAL_COMMUNITY): Payer: Self-pay

## 2017-07-24 ENCOUNTER — Other Ambulatory Visit: Payer: Self-pay | Admitting: Obstetrics and Gynecology

## 2017-07-24 ENCOUNTER — Telehealth: Payer: Self-pay | Admitting: Obstetrics and Gynecology

## 2017-07-24 ENCOUNTER — Encounter: Payer: Self-pay | Admitting: Obstetrics and Gynecology

## 2017-07-24 MED ORDER — METOCLOPRAMIDE HCL 10 MG PO TABS: 10.0000 mg | ORAL_TABLET | Freq: Four times a day (QID) | ORAL | 0 refills | Status: DC

## 2017-07-24 NOTE — Progress Notes (Signed)
Pt had some nausea and pain, none currently. ER torsion precautions given and will send in some reglan and will email scheduling to see if can move up surgery date from April  [place] Bingharlie Wynter Paige Mccarty, Paige HagemanJr MD Attending Center [facility] (Faculty Practice) 07/24/2017 Time: [phone]

## 2017-07-24 NOTE — Telephone Encounter (Signed)
GYN Telephone Note  Patient called at [phone] VM left for her to call the clinic.   Paige Mccarty, Jr MD Attending [facility] ([facility]ulty Practice) 07/24/2017 Time: [phone]

## 2017-07-24 NOTE — Telephone Encounter (Signed)
GYN Telephone Note  Patient called at [phone]. D/w her that I, unfortunately, don't have OR time until April but Dr. Earlene Plateravis, one of my partners, has time on Wednesday and if she would be okay with meeting her next week and going ahead with surgery, with her on Wednesday and pt was okay with this. In basket message sent to [facility] office to put her on Dr. Earlene Plateravis' schedule for Tuesday and surgery to set pt up for Wednesday. Already d/w Dr. Earlene Plateravis.   Paige Mccarty, Jr MD Attending [facility] ([facility]ulty Practice) 07/24/2017 Time: [phone]

## 2017-07-28 ENCOUNTER — Encounter (HOSPITAL_COMMUNITY)
Admission: RE | Admit: 2017-07-28 | Discharge: 2017-07-28 | Disposition: A | Discharge status: Home / Self Care | Source: Ambulatory Visit | Attending: Obstetrics and Gynecology | Admitting: Obstetrics and Gynecology

## 2017-07-28 ENCOUNTER — Encounter: Payer: Self-pay | Admitting: Obstetrics and Gynecology

## 2017-07-28 ENCOUNTER — Ambulatory Visit: Admitting: Obstetrics and Gynecology

## 2017-07-28 DIAGNOSIS — N83202 Unspecified ovarian cyst, left side: Secondary | ICD-10-CM

## 2017-07-28 NOTE — Patient Instructions (Signed)
Your procedure is scheduled on: Wednesday July 30, 2017 at 10:15  Enter through the [facility] at: 8:45 am  Pick up the phone at the desk and dial [phone].  Call this number if you have problems the morning of surgery: [phone].  Remember: Do NOT eat food or drink any liquids after: Midnight on Tuesday February 12  Take these medicines the morning of surgery with a SIP OF WATER:  STOP ALL VITAMINS AND SUPPLEMENTS 1 WEEK PRIOR TO SURGERY  DO NOT SMOKE DAY OF SURGERY  Do NOT wear jewelry (body piercing), metal hair clips/bobby pins, make-up, or nail polish. Do NOT wear lotions, powders, or perfumes.  You may wear deoderant. Do NOT shave for 48 hours prior to surgery. Do NOT bring valuables to the hospital. Contacts, dentures, or bridgework may not be worn into surgery.  Have a responsible adult drive you home and stay with you for 24 hours after your procedure

## 2017-07-28 NOTE — Progress Notes (Signed)
Pt refused weight check & VITALS just wants to talk w/ Dr.Davis about procedure

## 2017-07-28 NOTE — Progress Notes (Signed)
Patient not seen due to lack on insurance

## 2017-07-29 ENCOUNTER — Ambulatory Visit: Admitting: Obstetrics and Gynecology

## 2017-07-30 ENCOUNTER — Ambulatory Visit (HOSPITAL_COMMUNITY): Admission: RE | Admit: 2017-07-30 | Source: Ambulatory Visit | Admitting: Obstetrics and Gynecology

## 2017-07-30 ENCOUNTER — Encounter (HOSPITAL_COMMUNITY): Admission: RE | Payer: Self-pay | Source: Ambulatory Visit

## 2017-07-30 ENCOUNTER — Ambulatory Visit: Admitting: Obstetrics and Gynecology

## 2017-07-30 SURGERY — EXCISION, CYST, OVARY, LAPAROSCOPIC
Anesthesia: Choice | Laterality: Left

## 2017-08-06 ENCOUNTER — Encounter (HOSPITAL_COMMUNITY): Payer: Self-pay

## 2017-08-06 ENCOUNTER — Encounter: Payer: Self-pay | Admitting: *Deleted

## 2017-08-11 ENCOUNTER — Other Ambulatory Visit: Payer: Self-pay | Admitting: Obstetrics and Gynecology

## 2017-08-11 ENCOUNTER — Encounter (INDEPENDENT_AMBULATORY_CARE_PROVIDER_SITE_OTHER): Payer: Self-pay

## 2017-08-11 NOTE — Patient Instructions (Addendum)
Your procedure is scheduled on:  Wednesday, March 6  [facility] at: 9:45 am  Pick up the phone at the desk and dial [phone].  Call this number if you have problems the morning of surgery: [phone].  Remember: Do NOT eat or Do NOT drink clear liquids (including water) after midnight Tuesday  Take these medicines the morning of surgery with a SIP OF WATER:  None  Do NOT wear jewelry (body piercing), metal hair clips/bobby pins, make-up, or nail polish. Do NOT wear lotions, powders, or perfumes.  You may wear deoderant. Do NOT shave for 48 hours prior to surgery. Do NOT bring valuables to the hospital.  Have a responsible adult drive you home and stay with you for 24 hours after your procedure.  Home with [name] cell [phone].

## 2017-08-15 ENCOUNTER — Encounter (HOSPITAL_COMMUNITY): Payer: Self-pay

## 2017-08-15 ENCOUNTER — Other Ambulatory Visit: Payer: Self-pay

## 2017-08-15 ENCOUNTER — Encounter (HOSPITAL_COMMUNITY)
Admission: RE | Admit: 2017-08-15 | Discharge: 2017-08-15 | Disposition: A | Discharge status: Home / Self Care | Source: Ambulatory Visit | Attending: Obstetrics and Gynecology | Admitting: Obstetrics and Gynecology

## 2017-08-15 DIAGNOSIS — Z01812 Encounter for preprocedural laboratory examination: Secondary | ICD-10-CM | POA: Diagnosis present

## 2017-08-15 HISTORY — DX: Unspecified ovarian cyst, unspecified side: N83.209

## 2017-08-15 LAB — CBC
HEMATOCRIT: 39.4 % (ref 36.0–46.0)
HEMOGLOBIN: 14.1 g/dL (ref 12.0–15.0)
MCH: 30.7 pg (ref 26.0–34.0)
MCHC: 35.8 g/dL (ref 30.0–36.0)
MCV: 85.8 fL (ref 78.0–100.0)
Platelets: 316 10*3/uL (ref 150–400)
RBC: 4.59 MIL/uL (ref 3.87–5.11)
RDW: 12.5 % (ref 11.5–15.5)
WBC: 4.9 10*3/uL (ref 4.0–10.5)

## 2017-08-15 LAB — TYPE AND SCREEN
ABO/RH(D): O POS
Antibody Screen: NEGATIVE

## 2017-08-15 LAB — ABO/RH: ABO/RH(D): O POS

## 2017-08-20 ENCOUNTER — Encounter (HOSPITAL_COMMUNITY): Payer: Self-pay

## 2017-08-20 ENCOUNTER — Encounter (HOSPITAL_COMMUNITY)
Admission: RE | Disposition: A | Discharge status: Home / Self Care | Payer: Self-pay | Source: Ambulatory Visit | Attending: Obstetrics and Gynecology

## 2017-08-20 ENCOUNTER — Ambulatory Visit (HOSPITAL_COMMUNITY): Admitting: Certified Registered Nurse Anesthetist

## 2017-08-20 ENCOUNTER — Observation Stay (HOSPITAL_COMMUNITY)
Admission: RE | Admit: 2017-08-20 | Discharge: 2017-08-20 | Disposition: A | Discharge status: Home / Self Care | Source: Ambulatory Visit | Attending: Obstetrics and Gynecology | Admitting: Obstetrics and Gynecology

## 2017-08-20 ENCOUNTER — Other Ambulatory Visit: Payer: Self-pay

## 2017-08-20 DIAGNOSIS — N83202 Unspecified ovarian cyst, left side: Secondary | ICD-10-CM | POA: Diagnosis present

## 2017-08-20 DIAGNOSIS — N801 Endometriosis of ovary: Principal | ICD-10-CM | POA: Insufficient documentation

## 2017-08-20 DIAGNOSIS — Z9889 Other specified postprocedural states: Secondary | ICD-10-CM

## 2017-08-20 DIAGNOSIS — R4182 Altered mental status, unspecified: Secondary | ICD-10-CM | POA: Diagnosis not present

## 2017-08-20 DIAGNOSIS — N80129 Deep endometriosis of ovary, unspecified ovary: Secondary | ICD-10-CM

## 2017-08-20 DIAGNOSIS — F449 Dissociative and conversion disorder, unspecified: Secondary | ICD-10-CM

## 2017-08-20 DIAGNOSIS — N803 Endometriosis of pelvic peritoneum: Secondary | ICD-10-CM | POA: Diagnosis not present

## 2017-08-20 DIAGNOSIS — N809 Endometriosis, unspecified: Secondary | ICD-10-CM

## 2017-08-20 HISTORY — PX: LAPAROSCOPY: SHX197

## 2017-08-20 LAB — PREGNANCY, URINE: Preg Test, Ur: NEGATIVE

## 2017-08-20 SURGERY — LAPAROSCOPY, DIAGNOSTIC
Anesthesia: General | Site: Abdomen | Laterality: Left

## 2017-08-20 MED ORDER — FENTANYL CITRATE (PF) 250 MCG/5ML IJ SOLN
INTRAMUSCULAR | Status: AC
  Filled 2017-08-20: qty 5

## 2017-08-20 MED ORDER — DEXAMETHASONE SODIUM PHOSPHATE 4 MG/ML IJ SOLN
INTRAMUSCULAR | Status: AC
  Filled 2017-08-20: qty 1

## 2017-08-20 MED ORDER — SCOPOLAMINE 1 MG/3DAYS TD PT72
1.0000 | MEDICATED_PATCH | Freq: Once | TRANSDERMAL | Status: DC
  Administered 2017-08-20: 1.5 mg via TRANSDERMAL

## 2017-08-20 MED ORDER — SUGAMMADEX SODIUM 200 MG/2ML IV SOLN
INTRAVENOUS | Status: DC | PRN
  Administered 2017-08-20: 100 mg via INTRAVENOUS

## 2017-08-20 MED ORDER — OXYCODONE-ACETAMINOPHEN 5-325 MG PO TABS: 1.0000 | ORAL_TABLET | Freq: Four times a day (QID) | ORAL | 0 refills | Status: DC | PRN

## 2017-08-20 MED ORDER — OXYCODONE HCL 5 MG/5ML PO SOLN: 5.0000 mg | Freq: Once | ORAL | Status: DC | PRN

## 2017-08-20 MED ORDER — MIDAZOLAM HCL 2 MG/2ML IJ SOLN
INTRAMUSCULAR | Status: AC
  Filled 2017-08-20: qty 2

## 2017-08-20 MED ORDER — FENTANYL CITRATE (PF) 100 MCG/2ML IJ SOLN: 25.0000 ug | INTRAMUSCULAR | Status: DC | PRN

## 2017-08-20 MED ORDER — PHENYLEPHRINE 40 MCG/ML (10ML) SYRINGE FOR IV PUSH (FOR BLOOD PRESSURE SUPPORT)
PREFILLED_SYRINGE | INTRAVENOUS | Status: AC
  Filled 2017-08-20: qty 10

## 2017-08-20 MED ORDER — ONDANSETRON HCL 4 MG/2ML IJ SOLN
INTRAMUSCULAR | Status: DC | PRN
  Administered 2017-08-20: 4 mg via INTRAVENOUS

## 2017-08-20 MED ORDER — KETOROLAC TROMETHAMINE 30 MG/ML IJ SOLN
INTRAMUSCULAR | Status: DC | PRN
  Administered 2017-08-20: 30 mg via INTRAVENOUS

## 2017-08-20 MED ORDER — LIDOCAINE HCL (PF) 1 % IJ SOLN
INTRAMUSCULAR | Status: AC
  Filled 2017-08-20: qty 5

## 2017-08-20 MED ORDER — KETOROLAC TROMETHAMINE 30 MG/ML IJ SOLN: 30.0000 mg | Freq: Once | INTRAMUSCULAR | Status: DC | PRN

## 2017-08-20 MED ORDER — PROPOFOL 10 MG/ML IV BOLUS
INTRAVENOUS | Status: DC | PRN
  Administered 2017-08-20: 70 mg via INTRAVENOUS

## 2017-08-20 MED ORDER — GLYCOPYRROLATE 0.2 MG/ML IJ SOLN
INTRAMUSCULAR | Status: AC
Start: 2017-08-20 — End: ?
  Filled 2017-08-20: qty 1

## 2017-08-20 MED ORDER — ONDANSETRON HCL 4 MG/2ML IJ SOLN: 4.0000 mg | Freq: Once | INTRAMUSCULAR | Status: DC | PRN

## 2017-08-20 MED ORDER — BUPIVACAINE HCL (PF) 0.5 % IJ SOLN
INTRAMUSCULAR | Status: AC
Start: 2017-08-20 — End: ?
  Filled 2017-08-20: qty 30

## 2017-08-20 MED ORDER — SOD CITRATE-CITRIC ACID 500-334 MG/5ML PO SOLN
ORAL | Status: AC
  Administered 2017-08-20: 30 mL via ORAL
  Filled 2017-08-20: qty 15

## 2017-08-20 MED ORDER — BUPIVACAINE HCL 0.5 % IJ SOLN
INTRAMUSCULAR | Status: DC | PRN
  Administered 2017-08-20: 17 mL

## 2017-08-20 MED ORDER — ROCURONIUM BROMIDE 100 MG/10ML IV SOLN
INTRAVENOUS | Status: DC | PRN
  Administered 2017-08-20: 40 mg via INTRAVENOUS
  Administered 2017-08-20: 10 mg via INTRAVENOUS

## 2017-08-20 MED ORDER — SCOPOLAMINE 1 MG/3DAYS TD PT72
MEDICATED_PATCH | TRANSDERMAL | Status: AC
  Administered 2017-08-20: 1.5 mg via TRANSDERMAL
  Filled 2017-08-20: qty 1

## 2017-08-20 MED ORDER — GLYCOPYRROLATE 0.2 MG/ML IJ SOLN
INTRAMUSCULAR | Status: DC | PRN
  Administered 2017-08-20: 0.1 mg via INTRAVENOUS

## 2017-08-20 MED ORDER — ONDANSETRON 8 MG PO TBDP: 8.0000 mg | ORAL_TABLET | Freq: Three times a day (TID) | ORAL | 0 refills | Status: DC | PRN

## 2017-08-20 MED ORDER — ONDANSETRON HCL 4 MG/2ML IJ SOLN
INTRAMUSCULAR | Status: AC
  Filled 2017-08-20: qty 2

## 2017-08-20 MED ORDER — PROPOFOL 10 MG/ML IV BOLUS
INTRAVENOUS | Status: AC
  Filled 2017-08-20: qty 20

## 2017-08-20 MED ORDER — SOD CITRATE-CITRIC ACID 500-334 MG/5ML PO SOLN
30.0000 mL | ORAL | Status: AC
  Administered 2017-08-20: 30 mL via ORAL

## 2017-08-20 MED ORDER — FENTANYL CITRATE (PF) 100 MCG/2ML IJ SOLN
INTRAMUSCULAR | Status: DC | PRN
  Administered 2017-08-20 (×2): 50 ug via INTRAVENOUS

## 2017-08-20 MED ORDER — ROCURONIUM BROMIDE 100 MG/10ML IV SOLN
INTRAVENOUS | Status: AC
  Filled 2017-08-20: qty 1

## 2017-08-20 MED ORDER — MIDAZOLAM HCL 2 MG/2ML IJ SOLN
INTRAMUSCULAR | Status: DC | PRN
  Administered 2017-08-20 (×2): 1 mg via INTRAVENOUS

## 2017-08-20 MED ORDER — MEPERIDINE HCL 25 MG/ML IJ SOLN: 6.2500 mg | INTRAMUSCULAR | Status: DC | PRN

## 2017-08-20 MED ORDER — LIDOCAINE HCL (CARDIAC) 20 MG/ML IV SOLN
INTRAVENOUS | Status: DC | PRN
  Administered 2017-08-20: 50 mg via INTRAVENOUS

## 2017-08-20 MED ORDER — ACETAMINOPHEN 325 MG PO TABS: 325.0000 mg | ORAL_TABLET | ORAL | Status: DC | PRN

## 2017-08-20 MED ORDER — DEXAMETHASONE SODIUM PHOSPHATE 10 MG/ML IJ SOLN
INTRAMUSCULAR | Status: DC | PRN
  Administered 2017-08-20: 4 mg via INTRAVENOUS

## 2017-08-20 MED ORDER — KETOROLAC TROMETHAMINE 10 MG PO TABS: 10.0000 mg | ORAL_TABLET | Freq: Three times a day (TID) | ORAL | 0 refills | Status: DC | PRN

## 2017-08-20 MED ORDER — LACTATED RINGERS IV SOLN
INTRAVENOUS | Status: DC
  Administered 2017-08-20 (×2): via INTRAVENOUS

## 2017-08-20 MED ORDER — KETOROLAC TROMETHAMINE 30 MG/ML IJ SOLN
INTRAMUSCULAR | Status: AC
  Filled 2017-08-20: qty 1

## 2017-08-20 MED ORDER — OXYCODONE HCL 5 MG PO TABS: 5.0000 mg | ORAL_TABLET | Freq: Once | ORAL | Status: DC | PRN

## 2017-08-20 MED ORDER — ACETAMINOPHEN 160 MG/5ML PO SOLN: 325.0000 mg | ORAL | Status: DC | PRN

## 2017-08-20 MED ORDER — PHENYLEPHRINE HCL 10 MG/ML IJ SOLN
INTRAMUSCULAR | Status: DC | PRN
  Administered 2017-08-20: .08 mg via INTRAVENOUS
  Administered 2017-08-20 (×2): .04 mg via INTRAVENOUS

## 2017-08-20 MED ORDER — SUGAMMADEX SODIUM 200 MG/2ML IV SOLN
INTRAVENOUS | Status: AC
  Filled 2017-08-20: qty 2

## 2017-08-20 SURGICAL SUPPLY — 40 items
APPLICATOR COTTON TIP 6IN STRL (MISCELLANEOUS) ×4 IMPLANT
BLADE SURG 15 STRL LF C SS BP (BLADE) ×2 IMPLANT
BLADE SURG 15 STRL SS (BLADE) ×2
CABLE HIGH FREQUENCY MONO STRZ (ELECTRODE) ×4 IMPLANT
DEFOGGER SCOPE WARMER CLEARIFY (MISCELLANEOUS) ×4 IMPLANT
DERMABOND ADVANCED (GAUZE/BANDAGES/DRESSINGS) ×2
DERMABOND ADVANCED .7 DNX12 (GAUZE/BANDAGES/DRESSINGS) ×2 IMPLANT
DRSG OPSITE POSTOP 3X4 (GAUZE/BANDAGES/DRESSINGS) ×4 IMPLANT
DURAPREP 26ML APPLICATOR (WOUND CARE) ×4 IMPLANT
ELECT REM PT RETURN 9FT ADLT (ELECTROSURGICAL) ×4
ELECTRODE REM PT RTRN 9FT ADLT (ELECTROSURGICAL) ×2 IMPLANT
GLOVE BIOGEL PI IND STRL 7.0 (GLOVE) ×4 IMPLANT
GLOVE BIOGEL PI IND STRL 7.5 (GLOVE) ×4 IMPLANT
GLOVE BIOGEL PI INDICATOR 7.0 (GLOVE) ×4
GLOVE BIOGEL PI INDICATOR 7.5 (GLOVE) ×4
GLOVE SURG SS PI 7.0 STRL IVOR (GLOVE) ×4 IMPLANT
GOWN STRL REUS W/TWL LRG LVL3 (GOWN DISPOSABLE) ×12 IMPLANT
LIGASURE VESSEL 5MM BLUNT TIP (ELECTROSURGICAL) IMPLANT
NS IRRIG 1000ML POUR BTL (IV SOLUTION) ×4 IMPLANT
PACK LAPAROSCOPY BASIN (CUSTOM PROCEDURE TRAY) ×4 IMPLANT
PACK TRENDGUARD 450 HYBRID PRO (MISCELLANEOUS) ×2 IMPLANT
PACK TRENDGUARD 600 HYBRD PROC (MISCELLANEOUS) IMPLANT
PAD OB MATERNITY 4.3X12.25 (PERSONAL CARE ITEMS) ×4 IMPLANT
POUCH LAPAROSCOPIC INSTRUMENT (MISCELLANEOUS) ×4 IMPLANT
POUCH SPECIMEN RETRIEVAL 10MM (ENDOMECHANICALS) IMPLANT
PROTECTOR NERVE ULNAR (MISCELLANEOUS) ×8 IMPLANT
SCISSORS LAP 5X35 DISP (ENDOMECHANICALS) ×4 IMPLANT
SET IRRIG TUBING LAPAROSCOPIC (IRRIGATION / IRRIGATOR) ×4 IMPLANT
SLEEVE ADV FIXATION 5X100MM (TROCAR) IMPLANT
SLEEVE XCEL OPT CAN 5 100 (ENDOMECHANICALS) ×4 IMPLANT
SUT MON AB 4-0 PS1 27 (SUTURE) ×4 IMPLANT
SUT VICRYL 0 UR6 27IN ABS (SUTURE) ×4 IMPLANT
SYR 10ML LL (SYRINGE) ×4 IMPLANT
TOWEL OR 17X24 6PK STRL BLUE (TOWEL DISPOSABLE) ×8 IMPLANT
TRAY FOLEY CATH SILVER 14FR (SET/KITS/TRAYS/PACK) ×4 IMPLANT
TRENDGUARD 450 HYBRID PRO PACK (MISCELLANEOUS) ×4
TRENDGUARD 600 HYBRID PROC PK (MISCELLANEOUS)
TROCAR ADV FIXATION 5X100MM (TROCAR) ×8 IMPLANT
TROCAR BALLN 12MMX100 BLUNT (TROCAR) ×8 IMPLANT
TROCAR XCEL NON-BLD 5MMX100MML (ENDOMECHANICALS) ×4 IMPLANT

## 2017-08-20 NOTE — Transfer of Care (Signed)
Immediate Anesthesia Transfer of Care Note  Patient: Engineer, productionKenyatta Denise Sterba  Procedure(s) Performed: LAPAROSCOPY DIAGNOSTIC WITH DRAINAGE OF LEFT OVARIAN CYST (Left Abdomen)  Patient Location: PACU  Anesthesia Type:General  Level of Consciousness: awake, alert  and oriented  Airway & Oxygen Therapy: Patient Spontanous Breathing and Patient connected to nasal cannula oxygen  Post-op Assessment: Report given to RN and Post -op Vital signs reviewed and stable  Post vital signs: Reviewed and stable  Last Vitals:  Vitals:   08/20/17 0943  BP: 110/80  Pulse: 91  Resp: 16  Temp: 36.4 C  SpO2: 100%    Last Pain:  Vitals:   08/20/17 0943  TempSrc: Oral  PainSc: 3       Patients Stated Pain Goal: 3 (08/20/17 0943)  Complications: No apparent anesthesia complications

## 2017-08-20 NOTE — Progress Notes (Signed)

## 2017-08-20 NOTE — Anesthesia Procedure Notes (Signed)
Procedure Name: Intubation Date/Time: 08/20/2017 11:23 AM Performed by: Cleda ClarksBrowder, Jarmar Rousseau R, CRNA Pre-anesthesia Checklist: Patient identified, Emergency Drugs available, Suction available and Patient being monitored Patient Re-evaluated:Patient Re-evaluated prior to induction Oxygen Delivery Method: Circle system utilized Preoxygenation: Pre-oxygenation with 100% oxygen Induction Type: IV induction Ventilation: Mask ventilation without difficulty Laryngoscope Size: Miller and 2 Grade View: Grade I Tube type: Oral Tube size: 7.0 mm Number of attempts: 1 Airway Equipment and Method: Stylet Placement Confirmation: ETT inserted through vocal cords under direct vision,  positive ETCO2 and breath sounds checked- equal and bilateral Secured at: 21 cm Tube secured with: Tape Dental Injury: Teeth and Oropharynx as per pre-operative assessment
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
## 2017-08-20 NOTE — Op Note (Signed)
Operative Note   08/20/2017  PRE-OP DIAGNOSIS *10cm symptomatic left ovarian cyst   POST-OP DIAGNOSIS *10cm left ovarian endometrioma *pelvic endometriosis  SURGEON: Surgeon(s) and Role:    * Royalton Bing, MD - Primary  ASSISTANT:    * Macon Large, Jethro Bastos, MD - Assisting  PROCEDURE: Diagnostic laparoscopy and drainage of left ovarian endometrioma  ANESTHESIA: General and local  ESTIMATED BLOOD LOSS: 10mL  DRAINS: indwelling foley with UOP per anesthesia note   TOTAL IV FLUIDS: per anesthesia note  VTE PROPHYLAXIS: SCDs to the bilateral lower extremities  ANTIBIOTICS: not indicated  SPECIMENS: none  DISPOSITION: PACU - hemodynamically stable.  CONDITION: stable  COMPLICATIONS: None  FINDINGS: normal stomach, liver, gallbladder edge, omentum. EGBUS, vaginal vault and cervix within normal limits. Normal anterior cul de sac, uterus except for some adhesion in the left uterosacral area (see below). Normal bilateral fallopian tubes, normal right ovary.   The left ovary was enlarged to approximately 10cm and only ovarian stromal tissue was seen with no definite cyst seen prior to cyst drainage. On drainage of the cyst, there was brown-chocolate liquid type fluid, with approximately removed from the cyst. After the cyst was drained, I still could only see ovarian stromal tissue and no evidence of delineation between the cyst and ovary, so decision made to only drain and not attempt a cyst removal. There was large amount of right utero sacral nodularity with brown endometriotic appearing cysts and scar tissue, with the entire area comprising about 4 x 4cm and approaching the rectum. In the right para ovarian space on the under side of the right ovary was an endometriotic black/brown implant and scarring. On the left side, the left ovary was adhesed to the left para ovarian space and to the left uterosacral ligament and left, posterior aspect of the uterus.   The anterior cul de  sac was normal   DESCRIPTION OF PROCEDURE: After informed consent was obtained, the patient was taken to the operating room where anesthesia was obtained without difficulty. The patient was positioned in the dorsal lithotomy position in Robbins stirrups and her arms were carefully tucked at her sides and the usual precautions were taken.  She was prepped and draped in normal sterile fashion.  Time-out was performed and a Foley catheter was placed into the bladder. A Hulka uterine manipulator was then placed in the uterus without incident. Gloves were then changed, and after injection of local anesthesia, the open technique was used to place an infraumbilical 12-mm baloon trocar under direct visualization. The laparoscope was introduced and CO2 gas was infused for pneumoperitoneum to a pressure of 15 mm Hg and the area below inspected for injury. A 5mm LUQ port was then placed under direct visualization after gastric decompression was done.  The patient was placed in Trendelenburg and the bowel was displaced up into the upper abdomen, and the right lateral and a suprapubic 5-mm ports were placed under direct visualization of the laparoscope, after injection of local anesthesia.    The cyst was opened with monopolar scissors and a suction irrigator placed inside and the cyst was drained. A thorough diagnostic laparoscopy was then performed   The 5mm ports were removed under visualization.   Before the umbilical trocar was removed the CO2 gas was released.  The fascia there was closed with 0 vicryl suture in a figure of eight fashion.   The skin incision at the umbilicus was closed with a subcuticular stitch of 4-0 monocryl.  The remaining skin incisions  were closed with Dermabond glue.  The patient tolerated the procedure well.  Sponge, lap and needle counts were correct x2.  The patient was taken to recovery room in excellent condition.  Given that patient was only symptomatic from the cyst, decision made to  alleviate symptoms by cyst decompression. Cystectomy not attempted due to her age, nulliparous status and inability to delineate cyst from ovary. Will do 6 months of Depot Lupron or Orlissa and then recommend patient stay on Aygestin until ready to conceive. If attempt made to remove endometriotic implants in the future, would recommend ureteral stents and general surgery on stand by.   Cornelia Copaharlie Nainika Newlun, Jr MD Attending [facility](Faculty Practice)

## 2017-08-20 NOTE — OR Nursing (Signed)
Pt alert but not able to communicte where she is.  Vital signs stable but not able to tell me who her mother is cant give her exact birthdate doesn't know her dr name cant tell me why shes in the hospital. She does mention that DR Vergie LivingPickens wasn't able to do her surgery today thinks a dr. Earlene Plateravis did her surgery.  Call to Dr. Arby BarretteHatchett to evaluate.  He assesses pt.  Call put in to Dr. Vergie LivingPickens. Pt mother brought back to PACU. Pt doesn't recognize her mother.  Pt remains stable and alert. Dozes off on and off on room air. Order received to send pt to 3rd floor for further Psych evaluation., Will continue to monitor closely. Margarita Mailoni Oluwafemi Villella rn

## 2017-08-20 NOTE — Anesthesia Preprocedure Evaluation (Signed)
Anesthesia Evaluation  Patient identified by MRN, date of birth, ID band Patient awake    Reviewed: Allergy & Precautions, H&P , NPO status , Patient's Chart, lab work & pertinent test results  Airway Mallampati: I  TM Distance: >3 FB Neck ROM: full    Dental no notable dental hx. (+) Teeth Intact   Pulmonary neg pulmonary ROS,    Pulmonary exam normal breath sounds clear to auscultation       Cardiovascular negative cardio ROS Normal cardiovascular exam Rhythm:regular Rate:Normal     Neuro/Psych negative neurological ROS  negative psych ROS   GI/Hepatic negative GI ROS, Neg liver ROS,   Endo/Other  negative endocrine ROS  Renal/GU negative Renal ROS  negative genitourinary   Musculoskeletal negative musculoskeletal ROS (+)   Abdominal Normal abdominal exam  (+)   Peds  Hematology negative hematology ROS (+)   Anesthesia Other Findings   Reproductive/Obstetrics negative OB ROS                             Anesthesia Physical Anesthesia Plan  ASA: I  Anesthesia Plan: General   Post-op Pain Management:    Induction: Intravenous  PONV Risk Score and Plan: 4 or greater and Ondansetron, Dexamethasone, Midazolam and Scopolamine patch - Pre-op  Airway Management Planned: Oral ETT  Additional Equipment:   Intra-op Plan:   Post-operative Plan: Extubation in OR  Informed Consent: I have reviewed the patients History and Physical, chart, labs and discussed the procedure including the risks, benefits and alternatives for the proposed anesthesia with the patient or authorized representative who has indicated his/her understanding and acceptance.   Dental Advisory Given  Plan Discussed with: CRNA and Surgeon  Anesthesia Plan Comments:         Anesthesia Quick Evaluation

## 2017-08-20 NOTE — Discharge Instructions (Addendum)
DISCHARGE INSTRUCTIONS: Laparoscopy  The following instructions have been prepared to help you care for yourself upon your return home today.  Wound care:  Do not get the incision wet for the first 24 hours. The incision should be kept clean and dry.  The Band-Aids or dressings may be removed the day after surgery.  Should the incision become sore, red, and swollen after the first week, check with your doctor.  Personal hygiene:  Shower the day after your procedure.  Activity and limitations:  Do NOT drive or operate any equipment today.  Do NOT lift anything more than 15 pounds for 2-3 weeks after surgery.  Do NOT rest in bed all day.  Walking is encouraged. Walk each day, starting slowly with 5-minute walks 3 or 4 times a day. Slowly increase the length of your walks.  Walk up and down stairs slowly.  Do NOT do strenuous activities, such as golfing, playing tennis, bowling, running, biking, weight lifting, gardening, mowing, or vacuuming for 2-4 weeks. Ask your doctor when it is okay to start.  Diet: Eat a light meal as desired this evening. You may resume your usual diet tomorrow.  Return to work: This is dependent on the type of work you do. For the most part you can return to a desk job within a week of surgery. If you are more active at work, please discuss this with your doctor.  What to expect after your surgery: You may have a slight burning sensation when you urinate on the first day. You may have a very small amount of blood in the urine. Expect to have a small amount of vaginal discharge/light bleeding for 1-2 weeks. It is not unusual to have abdominal soreness and bruising for up to 2 weeks. You may be tired and need more rest for about 1 week. You may experience shoulder pain for 24-72 hours. Lying flat in bed may relieve it.  Call your doctor for any of the following:  Develop a fever of 100.4 or greater  Inability to urinate 6 hours after discharge from  hospital  Severe pain not relieved by pain medications  Persistent of heavy bleeding at incision site  Redness or swelling around incision site after a week  Increasing nausea or vomiting  Patient Signature________________________________________ Nurse Signature_________________________________________Laparoscopic Surgery Discharge Instructions  Instructions Following Laparoscopic Surgery You have just undergone a laparoscopic surgery.  The following list should answer your most common questions.  Although we will discuss your surgery and post-operative instructions with you prior to your discharge, this list will serve as a reminder if you fail to recall the details of what we discussed.  We will discuss your surgery once again in detail at your post-op visit in two to four weeks. If you havent already done so, please call to make your appointment as soon as possible.  How you will feel: Although you have just undergone a major surgery, your recovery will be significantly shorter since the surgery was performed through much smaller incisions than the traditional approach.  You should feel slightly better each day.  If you suddenly feel much worse than the prior day, please call the clinic.  Its important during the early part of your recovery that you maintain some activity.  Walking is encouraged.  You will quicken your recovery by continued activity.  Incision:  Your incisions will be closed with dissolvable stitches or surgical adhesive (glue).  There may be Band-aids and/or Steri-strips covering your incisions.  If there is  no drainage from the incisions you may remove the Band-aids in one to two days.  You may notice some minor bruising at the incision sites.  This is common and will resolve within several days.  Please inform us if the redness at the edges of your incision appears to be spreading.  If the skin around your incision becomes warm to the touch, or if you notice a pus-like  drainage, please call the office.   Stairs/Driving/Activities: You may climb stairs if necessary.  If youve had general anesthesia, do not drive a car the rest of the day today.  You may begin light housework when you feel up to it, but avoid heavy lifting (more than 15-20lbs) or pushing until cleared for these activities by your physician.  Hygiene:  Do not soak your incisions.  Showers are acceptable but you may not take a bath or swim in a pool.  Cleanse your incisions daily with soap and water.  Medications:  Please resume taking any medications that you were taking prior to the surgery.  If we have prescribed any new medications for you, please take them as directed.  Constipation:  It is fairly common to experience some difficulty in moving your bowels following major surgery.  Being active will help to reduce this likelihood. A diet rich in fiber and plenty of liquids is desirable.  If you do become constipated, a mild laxative such as Miralax, Milk of Magnesia, or Metamucil, or a stool softener such as Colace, is recommended.  General Instructions: If you develop a fever of 100.5 degrees or higher, please call the office number(s) below for physician on call.

## 2017-08-20 NOTE — Addendum Note (Signed)
Addendum  created 08/20/17 1613 by Leilani AbleHatchett, Kyrin Gratz, MD   Sign clinical note
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
## 2017-08-20 NOTE — H&P (Signed)
Obstetrics and Gynecology Surgical H&P  Appointment Date: 08/20/2017  OBGYN Clinic: Center for [facility]  Referring Provider: Self  Chief Complaint: symptomatic left ovarian cyst  History of Present Illness: Paige Mccarty is a 27 y.o. African-American G0 (Patient's last menstrual period was 08/09/2017 (exact date).), seen for the above chief complaint. Her past medical history is significant for possible endometriosis.  Presented to ED mid October with sharp LLQ pain. No imaging done and tentatively dx with PID. Pt didn't think so so declined meds and called few days later and STI labs were negative. No imaging done. Pt presented mid December to mau b/c s/s didn't go away and u/s showed complex 10cm LO cyst and set up for f/u with [place]. Pt states discomfort is kinda vague and mid to left sided. Repeat u/s a in late January still showed it and stable at 10cm   Patient still with persistent s/s in the LLQ.   Review of Systems: as noted in the History of Present Illness.  Past Medical History:  Past Medical History:  Diagnosis Date  . Ovarian cyst     Past Surgical History:  Past Surgical History:  Procedure Laterality Date  . WISDOM TOOTH EXTRACTION     local    Past Obstetrical History:  OB History  Gravida Para Term Preterm AB Living  0 0 0 0 0 0  SAB TAB Ectopic Multiple Live Births  0 0 0 0 0        Past Gynecological History: As per HPI. Periods: qmonth 4-7d, not particularly heavy or painful History of Pap Smear(s): Yes.   Last pap 2018, which was negative per patient History of STI(s): No. She is currently using nothing for contraception or cycle control   Social History:  Social History   Socioeconomic History  . Marital status: Single    Spouse name: Not on file  . Number of children: Not on file  . Years of education: Not on file  . Highest education level: Not on file  Social Needs  . Financial resource strain:  Not on file  . Food insecurity - worry: Not on file  . Food insecurity - inability: Not on file  . Transportation needs - medical: Not on file  . Transportation needs - non-medical: Not on file  Occupational History  . Not on file  Tobacco Use  . Smoking status: Never Smoker  . Smokeless tobacco: Never Used  Substance and Sexual Activity  . Alcohol use: No    Frequency: Never  . Drug use: No  . Sexual activity: Yes    Birth control/protection: Condom  Other Topics Concern  . Not on file  Social History Narrative  . Not on file    Family History: History reviewed. No pertinent family history.   Medications @MEDADMINPROSE @ Current Facility-Administered Medications  Medication Dose Route Frequency Provider Last Rate Last Dose  . lactated ringers infusion   Intravenous Continuous [place] Bing, MD      . scopolamine (TRANSDERM-SCOP) 1 MG/3DAYS 1.5 mg  1 patch Transdermal Once Phillips Grout, MD   1.5 mg at 08/20/17 1610    Allergies Patient has no known allergies.   Physical Exam:  BP 110/80   Pulse 91   Temp 97.6 F (36.4 C) (Oral)   Resp 16   LMP 08/09/2017 (Exact Date)   SpO2 100%   BMI 22 General appearance: Well nourished, well developed female in no acute distress.  Cardiovascular: normal s1 and s2.  No murmurs, rubs or gallops. Respiratory:  Clear to auscultation bilateral. Normal respiratory effort Abdomen: positive bowel sounds and no masses, hernias; diffusely non tender to palpation, non distended Neuro/Psych:  Normal mood and affect.  Skin:  Warm and dry.   Pelvic exam from 07/18/2017  Lymphatic:  No inguinal lymphadenopathy.  Pelvic exam: is not limited by body habitus EGBUS: within normal limits, Vagina: within normal limits and with no blood or discharge in the vault, Cervix: normal appearing cervix without tenderness, discharge or lesions. Uterus:  nonenlarged and non tender and Adnexa:  right side normal. mildly ttp fullness in mid to left side  of pelvis Rectovaginal: deferred  Laboratory: UPT pending  Radiology:  CLINICAL DATA:  Left ovarian cyst  EXAM: TRANSABDOMINAL ULTRASOUND OF PELVIS  TECHNIQUE: Transabdominal ultrasound examination of the pelvis was performed including evaluation of the uterus, ovaries, adnexal regions, and pelvic cul-de-sac.  COMPARISON:  06/02/2017  FINDINGS: Uterus  Measurements: 8.3 x 3.1 x 5.4 cm. No fibroids or other mass visualized.  Endometrium  Thickness: 6 mm.  No focal abnormality visualized.  Right ovary  Not discretely visualized.  No adnexal mass is seen.  Left ovary  Measurements: 9.9 x 8.9 x 10.6 cm. 8.3 x 8.7 x 10.1 cm complex/hemorrhagic cystic lesion, grossly unchanged, previously 10.3 x 7.5 x 10.4 cm. No solid component/mural nodularity.  Other findings:  No abnormal free fluid.  IMPRESSION: 10.1 cm complex/hemorrhagic left ovarian cystic lesion, favoring an endometrioma. Physiologic hemorrhagic cyst is considered unlikely given lack of interval change. Given size, OB-GYN consultation is suggested for possible surgical consideration.   Electronically Signed   By: Charline BillsSriyesh  Krishnan M.D.   On: 07/14/2017 08:42  Assessment: pt stable  Plan:  D/w her re: diagnostic l/s, left ovarian cystectomy, all indicated procedures and she is amenable to proceeding. Can proceed if UPT is negative and OR is ready   Cornelia Copaharlie Jahson Emanuele, Jr MD Attending Center for [facility](Faculty Practice)

## 2017-08-20 NOTE — Discharge Summary (Signed)
Physician Discharge Summary  Patient ID: Paige Mccarty MRN: [id number] DOB/AGE: Nov 11, 1990 26 y.o.  Admit date: 08/20/2017 Discharge date: 08/20/2017  Admission Diagnoses: Left ovarian cyst Discharge Diagnoses:  Active Problems:   Altered mental status   Discharged Condition: stable  Hospital Course: pt underwent diagnostic operative laparoscopy See op note for details She was to go home from PACU but had altered mental status and had post anesthesia delirium and disorientation By 1800 that had resolved and she wanted to go home She will be seen back iin office in 2 weeks by Dr Vergie LivingPickens  Consults: None  Significant Diagnostic Studies:   Treatments: surgery:   Discharge Exam: Blood pressure 116/77, pulse 77, temperature 99.7 F (37.6 C), temperature source Oral, resp. rate 16, height 5\' 4"  (1.626 m), weight 126 lb (57.2 kg), last menstrual period 08/09/2017, SpO2 99 %. General appearance: alert, cooperative and no distress GI: soft, non-tender; bowel sounds normal; no masses,  no organomegaly Incision/Wound: clean dry intact  Oriented x 4  Disposition: 01-Home or Self Care  Discharge Instructions     Remove dressing in 72 hours   Complete by:  As directed    Call MD for:  persistant nausea and vomiting   Complete by:  As directed    Call MD for:  severe uncontrolled pain   Complete by:  As directed    Call MD for:  temperature >100.4   Complete by:  As directed    Diet - low sodium heart healthy   Complete by:  As directed    Discharge patient   Complete by:  As directed    Discharge disposition:  01-Home or Self Care   Discharge patient date:  08/20/2017   Driving Restrictions   Complete by:  As directed    No driving for 72 hours   Increase activity slowly   Complete by:  As directed    Lifting restrictions   Complete by:  As directed    Do not lift more than 10 pounds   Sexual Activity Restrictions   Complete by:  As directed    No sex for 2 weeks     Allergies as of 08/20/2017   No Known Allergies     Medication List    TAKE these medications   ketorolac 10 MG tablet Commonly known as:  TORADOL Take 1 tablet (10 mg total) by mouth every 8 (eight) hours as needed.   metoCLOPramide 10 MG tablet Commonly known as:  REGLAN Take 1 tablet (10 mg total) by mouth every 6 (six) hours.   ondansetron 8 MG disintegrating tablet Commonly known as:  ZOFRAN ODT Take 1 tablet (8 mg total) by mouth every 8 (eight) hours as needed for nausea or vomiting.   oxyCODONE-acetaminophen 5-325 MG tablet Commonly known as:  PERCOCET/ROXICET Take 1 tablet by mouth every 6 (six) hours as needed.   traMADol 50 MG tablet Commonly known as:  ULTRAM Take 1 tablet (50 mg total) by mouth every 6 (six) hours as needed for moderate pain or severe pain.      Follow-up Information    [place] BingPickens, Charlie, MD. Schedule an appointment as soon as possible for a visit in 2 week(s).   Specialty:  Obstetrics and Gynecology Contact information: [address] [place] [address] [phone]           Signed: Lazaro ArmsLuther H Ryah Cribb 08/20/2017, 6:26 PM

## 2017-08-21 ENCOUNTER — Encounter (HOSPITAL_COMMUNITY): Payer: Self-pay | Admitting: Obstetrics and Gynecology

## 2017-08-22 DIAGNOSIS — N809 Endometriosis, unspecified: Secondary | ICD-10-CM | POA: Diagnosis present

## 2017-08-22 DIAGNOSIS — N80129 Deep endometriosis of ovary, unspecified ovary: Secondary | ICD-10-CM

## 2017-08-25 ENCOUNTER — Telehealth: Payer: Self-pay | Admitting: Obstetrics and Gynecology

## 2017-08-25 NOTE — Telephone Encounter (Deleted)
GYN Telephone Note  Patient called at [phone]

## 2017-08-26 NOTE — Telephone Encounter (Signed)
GYN Telephone Note Patient doing well and d/w her re: surgery. Also d/w her recommendation for 37m of either lupron or orlissa and I prefer the latter af54ter d/w her re: r/b/a. After that I recommend either depo or aygestion qday. Pt to consider and will call and f/u with her re: next steps  Cornelia Copaharlie Helvi Royals, Jr MD Attending [facility] (Faculty Practice) 08/26/2017 Time: [phone]

## 2017-08-27 ENCOUNTER — Telehealth: Payer: Self-pay | Admitting: Obstetrics and Gynecology

## 2017-08-27 NOTE — Telephone Encounter (Signed)
GYN Telephone Note Patient called at [phone] and VM left to call back to let me know about next steps  Paige Mccarty, Jr MD Attending Center for [facility] ([facility] Practice) 08/27/2017 Time: [phone]

## 2017-08-28 ENCOUNTER — Telehealth: Payer: Self-pay | Admitting: Obstetrics and Gynecology

## 2017-08-28 MED ORDER — ELAGOLIX SODIUM 150 MG PO TABS: 1.0000 | ORAL_TABLET | Freq: Every day | ORAL | 1 refills | Status: DC

## 2017-08-28 NOTE — Telephone Encounter (Signed)
GYN Telephone Note Patient called at [phone] and pt amenable to starting suppression with orilissa after d/w pt again re: options.  150mg  po qday tablets sent in.  Cornelia Copaharlie Shavonne Ambroise, Jr MD Attending [facility] ([facility]ulty Practice) 08/28/2017 Time: 1146am

## 2017-09-01 ENCOUNTER — Encounter: Payer: Self-pay | Admitting: Obstetrics and Gynecology

## 2017-09-01 ENCOUNTER — Ambulatory Visit (INDEPENDENT_AMBULATORY_CARE_PROVIDER_SITE_OTHER): Admitting: Obstetrics and Gynecology

## 2017-09-01 VITALS — BP 118/66 | HR 88 | Temp 98.0°F | Ht 63.0 in | Wt 126.9 lb

## 2017-09-01 DIAGNOSIS — N809 Endometriosis, unspecified: Secondary | ICD-10-CM

## 2017-09-01 DIAGNOSIS — Z9889 Other specified postprocedural states: Secondary | ICD-10-CM

## 2017-09-01 MED ORDER — NORETHINDRONE ACETATE 5 MG PO TABS: 5.0000 mg | ORAL_TABLET | Freq: Every day | ORAL | 6 refills | Status: DC

## 2017-09-01 NOTE — Progress Notes (Signed)
[facility] 09/01/2017  CC: post op visit  Subjective:  She is s/p 08/20/2017 diagnostic laparoscopy, drainage of left ovarian endometrioma; see operative note for full details. Patient was discharged from the PACU, after having a psychiatric reaction to most likely the scopolamine patch.  She is doing well and meeting all post operative goals. She notes some soreness at the belly button and denies that the left ovarian cyst pain that she has is present but still notes her low level pelvic discomfort that she states that she's had for many years.    Objective:    BP 118/66   Pulse 88   Temp 98 F (36.7 C)   Ht 5\' 3"  (1.6 m)   Wt 126 lb 14.4 oz (57.6 kg)   LMP 08/09/2017 (Exact Date)   BMI 22.48 kg/m   NAD Abdomen: soft, nttp, no masses, or hernias. Well healed l/s port sites with dermabond in place   Assessment:    Doing well postoperatively.    Plan:  D/w her re: long term plan. She states that she has to have a prior authorization done to get approved for the [place]. In review of the form, the only issue is that she has not had a trial of hormones. She states that she had depo provera x 1 back in 2014 for birth control but didn't like it due to it making her feel odd and AUB; she states that she's been on NSAIDs for several months to help with the discomfort. Now that the cyst pain is gone she states that she's had low level pain for several years but has never been able to get an answer as to why; she states at one point she was told she had appendicitis but never had surgery for it. I told her that based on where the endo is that it is likely accounting for her symptoms. After d/w her, she states that she is amenable to referral to an endo and MIS specialists for consideration for removal of her endometriosis. I told her that no matter if surgery is done she will need to be on some sort of suppression treatment post op.  She states that the  referral has to come from her PCP so a message was sent today. In the interim, she is amenable to starting aygestin 5mg  po qday.  Cornelia Copaharlie Shanley Furlough, Jr MD Attending

## 2017-09-01 NOTE — Progress Notes (Signed)
Dear Paige ReedyNanette Fulp PA-C  Based on [name] evaluation, she has stage four endometriosis and desires surgical therapy for removal of as much of it as possible, while still retaining her fertility. Because of this, I recommend that she see a Reproductive Endocrinologist for evaluation and possible surgical intervention. She states that with her insurance, that you, as her PCP, have to initiate this. Please refer her to [facility]inkaya [facility], [place] for urgent referral.   Thank you so much for your help and your referral of [name].  Cornelia Copaharlie Francelia Mclaren, Jr MD Attending [facility] (Faculty Practice) 09/01/2017 Time: [phone]

## 2017-09-17 ENCOUNTER — Ambulatory Visit: Admitting: Obstetrics and Gynecology

## 2017-09-22 ENCOUNTER — Ambulatory Visit (HOSPITAL_COMMUNITY): Admission: RE | Admit: 2017-09-22 | Source: Home / Self Care | Admitting: Psychiatry

## 2017-09-22 ENCOUNTER — Inpatient Hospital Stay (HOSPITAL_COMMUNITY)
Admission: RE | Admit: 2017-09-22 | Discharge: 2017-09-26 | DRG: 885 | Disposition: A | Discharge status: Home / Self Care | Attending: Psychiatry | Admitting: Psychiatry

## 2017-09-22 DIAGNOSIS — F29 Unspecified psychosis not due to a substance or known physiological condition: Secondary | ICD-10-CM | POA: Diagnosis present

## 2017-09-22 DIAGNOSIS — F419 Anxiety disorder, unspecified: Secondary | ICD-10-CM | POA: Diagnosis not present

## 2017-09-22 DIAGNOSIS — F3160 Bipolar disorder, current episode mixed, unspecified: Principal | ICD-10-CM

## 2017-09-22 DIAGNOSIS — R45851 Suicidal ideations: Secondary | ICD-10-CM | POA: Diagnosis present

## 2017-09-22 DIAGNOSIS — G4700 Insomnia, unspecified: Secondary | ICD-10-CM | POA: Diagnosis present

## 2017-09-22 DIAGNOSIS — R450 Nervousness: Secondary | ICD-10-CM | POA: Diagnosis not present

## 2017-09-22 DIAGNOSIS — N809 Endometriosis, unspecified: Secondary | ICD-10-CM | POA: Diagnosis present

## 2017-09-22 MED ORDER — NORETHINDRONE ACETATE 5 MG PO TABS
5.0000 mg | ORAL_TABLET | Freq: Every day | ORAL | Status: DC
  Administered 2017-09-23 – 2017-09-25 (×3): 5 mg via ORAL

## 2017-09-22 MED ORDER — TRAZODONE HCL 50 MG PO TABS: 50.0000 mg | ORAL_TABLET | Freq: Every evening | ORAL | Status: DC | PRN

## 2017-09-22 MED ORDER — ACETAMINOPHEN 325 MG PO TABS: 650.0000 mg | ORAL_TABLET | Freq: Four times a day (QID) | ORAL | Status: DC | PRN

## 2017-09-22 MED ORDER — ENSURE ENLIVE PO LIQD
237.0000 mL | Freq: Two times a day (BID) | ORAL | Status: DC
  Administered 2017-09-23 – 2017-09-25 (×3): 237 mL via ORAL

## 2017-09-22 MED ORDER — ALUM & MAG HYDROXIDE-SIMETH 200-200-20 MG/5ML PO SUSP: 30.0000 mL | ORAL | Status: DC | PRN

## 2017-09-22 MED ORDER — OLANZAPINE 5 MG PO TBDP: 5.0000 mg | ORAL_TABLET | Freq: Three times a day (TID) | ORAL | Status: DC | PRN

## 2017-09-22 MED ORDER — MAGNESIUM HYDROXIDE 400 MG/5ML PO SUSP: 30.0000 mL | Freq: Every day | ORAL | Status: DC | PRN

## 2017-09-22 NOTE — Progress Notes (Signed)
D: Pt has minimal interaction on the unit. Pt forwards little information. Pt does not respond to Clinical research associatewriter when trying to engage. Pt has been sleep in room most of the evening  A: Pt was offered support and encouragement.  Pt was encourage to attend groups. Q 15 minute checks were done for safety.  R: and safety maintained on unit.
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
## 2017-09-22 NOTE — BH Assessment (Signed)
Admission Note: Patient is an 27 year old female who presented as a walk in for symptoms of paranoia, anxiety and depression.  Patient presents with blunted affect and paranoid behavior with period of thought blocking. Refused to engage or respond to assessment questions.  Admission plan of care reviewed and consent signed.  Skin assessment completed and personal belonging searched.  No contraband found.  Denies auditory and visual hallucinations.  Reports suicidal thoughts but verbally contracts for safety while in the hospital.  Unit rules and protocol explained.  Patient oriented to the unit, staff and room.  Routine safety checks initiated.  Offered support and encouragement as needed.  Patient is safe on the unit.

## 2017-09-22 NOTE — [facility] Group Notes (Signed)
LCSW Group Therapy Note   09/22/2017 1:15pm   Type of Therapy and Topic:  Group Therapy:  Overcoming Obstacles   Participation Level:  Minimal   Description of Group:    In this group patients will be encouraged to explore what they see as obstacles to their own wellness and recovery. They will be guided to discuss their thoughts, feelings, and behaviors related to these obstacles. The group will process together ways to cope with barriers, with attention given to specific choices patients can make. Each patient will be challenged to identify changes they are motivated to make in order to overcome their obstacles. This group will be process-oriented, with patients participating in exploration of their own experiences as well as giving and receiving support and challenge from other group members.   Therapeutic Goals: 1. Patient will identify personal and current obstacles as they relate to admission. 2. Patient will identify barriers that currently interfere with their wellness or overcoming obstacles.  3. Patient will identify feelings, thought process and behaviors related to these barriers. 4. Patient will identify two changes they are willing to make to overcome these obstacles:      Summary of Patient Progress   Stayed the entire time, engaged throughout.  Stated her biggest obstacle is a job "because I have been having issues."  Declined to speak further about that, but added that she does feel hopeful that we can be helpful.   Therapeutic Modalities:   Cognitive Behavioral Therapy Solution Focused Therapy Motivational Interviewing Relapse Prevention Therapy  Paige RogueRodney B Luanna Weesner, LCSW 09/22/2017 3:05 PM

## 2017-09-22 NOTE — H&P (Signed)
Behavioral Health Medical Screening Exam  Paige Mccarty is an 27 y.o. female who presented as a walk in and was noted to be acutely psychotic. Patient very paranoid and asking staff to check with someone in the [employer]. Patient accepted for inpatient admission due to severity of symptoms. Patient has been accepted to [facility]. Patient reports history of endometriosis for medical history. Reports suicidal ideation to overdose on tylenol and homicidal ideation towards co-workers.   Total Time spent with patient: 20 minutes  Psychiatric Specialty Exam: Physical Exam  Constitutional: She is oriented to person, place, and time. She appears well-developed and well-nourished.  HENT:  Head: Normocephalic.  Neck: Normal range of motion.  Cardiovascular: Normal rate, regular rhythm, normal heart sounds and intact distal pulses.  Respiratory: Effort normal and breath sounds normal.  GI: Soft. Bowel sounds are normal.  Musculoskeletal: Normal range of motion.  Neurological: She is alert and oriented to person, place, and time.  Skin: Skin is warm and dry.    ROS  Blood pressure 140/84, pulse (!) 113, temperature 98.5 F (36.9 C), resp. rate 20, SpO2 100 %.There is no height or weight on file to calculate BMI.  General Appearance: Casual  Eye Contact:  Fair  Speech:  Pressured  Volume:  Increased  Mood:  Anxious and Irritable  Affect:  Labile  Thought Process:  Irrelevant  Orientation:  Full (Time, Place, and Person)  Thought Content:  Paranoid Ideation  Suicidal Thoughts:  Yes.  with intent/plan  Homicidal Thoughts:  Yes.  with intent/plan  Memory:  Immediate;   Fair Recent;   Fair Remote;   Fair  Judgement:  Fair  Insight:  Lacking  Psychomotor Activity:  Restlessness  Concentration: Concentration: Poor and Attention Span: Poor  Recall:  Poor  Fund of Knowledge:Fair  Language: Fair  Akathisia:  No  Handed:  Right  AIMS (if indicated):     Assets:  Communication Skills Desire for  Improvement Leisure Time Physical Health Resilience Social Support  Sleep:       Musculoskeletal: Strength & Muscle Tone: within normal limits Gait & Station: normal Patient leans: N/A  Blood pressure 140/84, pulse (!) 113, temperature 98.5 F (36.9 C), resp. rate 20, SpO2 100 %.  Recommendations:  Based on my evaluation the patient does not appear to have an emergency medical condition.  Fransisca KaufmannAVIS, Goble Fudala, NP 09/22/2017, 1:00 PM

## 2017-09-23 DIAGNOSIS — F419 Anxiety disorder, unspecified: Secondary | ICD-10-CM

## 2017-09-23 DIAGNOSIS — G4700 Insomnia, unspecified: Secondary | ICD-10-CM

## 2017-09-23 DIAGNOSIS — R450 Nervousness: Secondary | ICD-10-CM

## 2017-09-23 DIAGNOSIS — R45851 Suicidal ideations: Secondary | ICD-10-CM

## 2017-09-23 DIAGNOSIS — F3160 Bipolar disorder, current episode mixed, unspecified: Principal | ICD-10-CM

## 2017-09-23 LAB — RAPID URINE DRUG SCREEN, HOSP PERFORMED
Amphetamines: NOT DETECTED
BARBITURATES: NOT DETECTED
Benzodiazepines: NOT DETECTED
COCAINE: NOT DETECTED
OPIATES: NOT DETECTED
Tetrahydrocannabinol: NOT DETECTED

## 2017-09-23 LAB — COMPREHENSIVE METABOLIC PANEL
ALBUMIN: 4 g/dL (ref 3.5–5.0)
ALK PHOS: 64 U/L (ref 38–126)
ALT: 10 U/L — ABNORMAL LOW (ref 14–54)
ANION GAP: 9 (ref 5–15)
AST: 15 U/L (ref 15–41)
BILIRUBIN TOTAL: 0.5 mg/dL (ref 0.3–1.2)
BUN: 12 mg/dL (ref 6–20)
CALCIUM: 9.4 mg/dL (ref 8.9–10.3)
CO2: 25 mmol/L (ref 22–32)
CREATININE: 0.93 mg/dL (ref 0.44–1.00)
Chloride: 104 mmol/L (ref 101–111)
GFR calc Af Amer: >60 mL/min (ref >60)
GFR calc non Af Amer: >60 mL/min (ref >60)
GLUCOSE: 77 mg/dL (ref 65–99)
Potassium: 4.2 mmol/L (ref 3.5–5.1)
Sodium: 138 mmol/L (ref 135–145)
TOTAL PROTEIN: 7.8 g/dL (ref 6.5–8.1)

## 2017-09-23 LAB — URINALYSIS, ROUTINE W REFLEX MICROSCOPIC
Bilirubin Urine: NEGATIVE
Glucose, UA: NEGATIVE mg/dL
Hgb urine dipstick: NEGATIVE
Ketones, ur: 5 mg/dL — AB
Nitrite: NEGATIVE
PROTEIN: NEGATIVE mg/dL
Specific Gravity, Urine: 1.015 (ref 1.005–1.030)
pH: 8 (ref 5.0–8.0)

## 2017-09-23 LAB — PREGNANCY, URINE: Preg Test, Ur: NEGATIVE

## 2017-09-23 LAB — CBC
HEMATOCRIT: 39.7 % (ref 36.0–46.0)
HEMOGLOBIN: 14 g/dL (ref 12.0–15.0)
MCH: 30.8 pg (ref 26.0–34.0)
MCHC: 35.3 g/dL (ref 30.0–36.0)
MCV: 87.4 fL (ref 78.0–100.0)
Platelets: 279 10*3/uL (ref 150–400)
RBC: 4.54 MIL/uL (ref 3.87–5.11)
RDW: 12.5 % (ref 11.5–15.5)
WBC: 8.2 10*3/uL (ref 4.0–10.5)

## 2017-09-23 LAB — TSH: TSH: 1.559 u[IU]/mL (ref 0.350–4.500)

## 2017-09-23 LAB — ETHANOL: Alcohol, Ethyl (B): <10 mg/dL (ref <10)

## 2017-09-23 MED ORDER — ARIPIPRAZOLE 10 MG PO TABS
10.0000 mg | ORAL_TABLET | Freq: Every day | ORAL | Status: DC
  Administered 2017-09-23 – 2017-09-24 (×2): 10 mg via ORAL
  Filled 2017-09-23 (×6): qty 1

## 2017-09-23 MED ORDER — HYDROXYZINE HCL 25 MG PO TABS: 25.0000 mg | ORAL_TABLET | Freq: Four times a day (QID) | ORAL | Status: DC | PRN

## 2017-09-23 NOTE — Progress Notes (Signed)
Adult Psychoeducational Group Note  Date:  09/23/2017 Time:  8:47 PM  Group Topic/Focus:  Wrap-Up Group:   The focus of this group is to help patients review their daily goal of treatment and discuss progress on daily workbooks.  Participation Level:  Active  Participation Quality:  Appropriate  Affect:  Appropriate  Cognitive:  Appropriate  Insight: Appropriate  Engagement in Group:  Engaged  Modes of Intervention:  Discussion  Additional Comments: The patient expressed that she rates today a 10.The patient also said she attended group.  Octavio Mannshigpen, Leota Maka Lee 09/23/2017, 8:47 PM

## 2017-09-23 NOTE — [facility] Suicide Risk Assessment (Signed)
[facility] INPATIENT:  Family/Significant Other Suicide Prevention Education  Suicide Prevention Education:  Patient Refusal for Family/Significant Other Suicide Prevention Education: The patient Paige Mccarty has refused to provide written consent for family/significant other to be provided Family/Significant Other Suicide Prevention Education during admission and/or prior to discharge.  Physician notified.  Paige Mccarty 09/23/2017, 4:47 PM
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
## 2017-09-23 NOTE — Progress Notes (Signed)
DAR NOTE: Patient presents with flat affect and depressed mood.  Brightens up a little upon approach.  Reports feeling better today.  Denies suicidal thoughts, auditory and visual hallucinations.  Described energy level as normal and concentration as good.  Rates depression at 3, hopelessness at 3, and anxiety at 4.  Maintained on routine safety checks.  Medications given as prescribed.  Support and encouragement offered as needed.  Attended group and participated.  States goal for today is "sleep schedule and communication."  Patient visible in milieu interacting with peers and nursing student.  Offered no complaint.

## 2017-09-23 NOTE — Plan of Care (Signed)
  Problem: Coping: Goal: Ability to identify and develop effective coping behavior will improve Outcome: Progressing   Problem: Safety: Goal: Ability to disclose and discuss suicidal ideas will improve Outcome: Progressing   Problem: Self-Concept: Goal: Will verbalize positive feelings about self Outcome: Progressing

## 2017-09-23 NOTE — [facility] Group Notes (Signed)
LCSW Group Therapy Note   09/23/2017 1:15pm   Type of Therapy and Topic:  Group Therapy:  Positive Affirmations   Participation Level:  Minimal  Description of Group: This group addressed positive affirmation toward self and others. Patients went around the room and identified two positive things about themselves and two positive things about a peer in the room. Patients reflected on how it felt to share something positive with others, to identify positive things about themselves, and to hear positive things from others. Patients were encouraged to have a daily reflection of positive characteristics or circumstances.  Therapeutic Goals 1. Patient will verbalize two of their positive qualities 2. Patient will demonstrate empathy for others by stating two positive qualities about a peer in the group 3. Patient will verbalize their feelings when voicing positive self affirmations and when voicing positive affirmations of others 4. Patients will discuss the potential positive impact on their wellness/recovery of focusing on positive traits of self and others. Summary of Patient Progress:  Was out most of the time with provider, but engaged and active when present.  "When I couldn't afford college, I didn't let that stop me from getting a degree.  I joined the [employer] so that I would have my schooling paid for."  Therapeutic Modalities Cognitive Behavioral Therapy Motivational Interviewing  [name], KentuckyLCS[place] 09/23/2017 2:41 PM

## 2017-09-23 NOTE — Progress Notes (Signed)
NUTRITION ASSESSMENT  Pt identified as at risk on the Malnutrition Screen Tool  INTERVENTION: 1. Supplements: Continue Ensure Enlive po BID, each supplement provides 350 kcal and 20 grams of protein  NUTRITION DIAGNOSIS: Unintentional weight loss related to sub-optimal intake as evidenced by pt report.   Goal: Pt to meet >/= 90% of their estimated nutrition needs.  Monitor:  PO intake  Assessment:  Pt admitted with acute psychosis. Pt reports 30 lb of weight loss over the past year. Per chart review, pt has lost 17 lb since October 2018 (13% wt loss x 6 months, significant for time frame). Pt has been ordered Ensure supplements, will continue order.   Height: Ht Readings from Last 1 Encounters:  09/22/17 5\' 4"  (1.626 m)    Weight: Wt Readings from Last 1 Encounters:  09/22/17 117 lb (53.1 kg)    Weight Hx: Wt Readings from Last 10 Encounters:  09/22/17 117 lb (53.1 kg)  09/01/17 126 lb 14.4 oz (57.6 kg)  08/20/17 126 lb (57.2 kg)  08/15/17 126 lb (57.2 kg)  07/18/17 126 lb 4.8 oz (57.3 kg)  06/02/17 137 lb (62.1 kg)  03/31/17 134 lb (60.8 kg)    BMI:  Body mass index is 20.08 kg/m. Pt meets criteria for normal based on current BMI.  Estimated Nutritional Needs: Kcal: 25-30 kcal/kg Protein: > 1 gram protein/kg Fluid: 1 ml/kcal  Diet Order: Diet regular Room service appropriate? Yes; Fluid consistency: Thin Pt is also offered choice of unit snacks mid-morning and mid-afternoon.  Pt is eating as desired.   Lab results and medications reviewed.   Tilda FrancoLindsey Eydan Chianese, MS, RD, LDN [facility] Clinical Dietitian Pager: [phone] After Hours Pager: [phone]

## 2017-09-23 NOTE — BHH Counselor (Signed)
Adult Comprehensive Assessment  Patient ID: Kayley Zeiders, female   DOB: 1990/11/11, 27 y.o.   MRN: [id number]  Information Source: Information source: Patient  Current Stressors:  Employment / Job issues: "I've been having issues"-works as a Fish farm manager / Lack of resources (include bankruptcy): states that finances are a stressor Physical health (include injuries & life threatening diseases): Had a recent surgery and is stressed about waiting for some test results  Living/Environment/Situation:  Living Arrangements: Alone Living conditions (as described by patient or guardian): good How long has patient lived in current situation?: a year What is atmosphere in current home: Comfortable  Family History:  Marital status: Single Are you sexually active?: No What is your sexual orientation?: female, "I prefer to talk to God" Does patient have children?: No  Childhood History:  By whom was/is the patient raised?: Mother Additional childhood history information: dad was not in picture-had a step dad-relationship was [facility] Description of patient's relationship with caregiver when they were a child: OK Patient's description of current relationship with people who raised him/her: good Does patient have siblings?: Yes Number of Siblings: 6 Description of patient's current relationship with siblings: not really close with anyone-does hava twin Did patient suffer any verbal/emotional/physical/sexual abuse as a child?: No Did patient suffer from severe childhood neglect?: No Has patient ever been sexually abused/assaulted/raped as an adolescent or adult?: No Was the patient ever a victim of a crime or a disaster?: No Witnessed domestic violence?: No Has patient been effected by domestic violence as an adult?: No  Education:  Highest grade of school patient has completed: 97, took college courses Currently a Consulting civil engineer?: No Learning disability?: No  Employment/Work  Situation:   Employment situation: Employed Where is patient currently employed?: [employer] long has patient been employed?: 7 years Patient's job has been impacted by current illness: Yes Describe how patient's job has been impacted: States she has been "having issues" and just left work the other day and has not returned What is the longest time patient has a held a job?: Same Where was the patient employed at that time?: Same Has patient ever been in the [employer]?: Yes (Describe in comment)(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) Did You Receive Any Psychiatric Treatment/Services While in [employer]?: No Are There Guns or Other Weapons in Your Home?: No  Financial Resources:   Financial resources: Income from employment Does patient have a representative payee or guardian?: No  Alcohol/Substance Abuse:   What has been your use of drugs/alcohol within the last 12 months?: Denies Alcohol/Substance Abuse Treatment Hx: Denies past history Has alcohol/substance abuse ever caused legal problems?: No  Social Support System:   Conservation officer, nature Support System: Fair Development worker, community Support System: mom, aunt Type of faith/religion: N/A How does patient's faith help to cope with current illness?: N/A  Leisure/Recreation:   Leisure and Hobbies: "I don't know"  Strengths/Needs:   What things does the patient do well?: I don't know In what areas does patient struggle / problems for patient: Everything  Discharge Plan:   Does patient have access to transportation?: Yes Will patient be returning to same living situation after discharge?: Yes  Summary/Recommendations:   Summary and Recommendations (to be completed by the evaluator): Charline is a 27 YO AA female diagnosed with Bipolar D/O, mixed.  She presents voluntarily with lack of sleep, anxiety and confusion.  Raynetta denies she has ever had any mental health issues nor help in the past.  At d/c, she will  return home and follow up  outpatient.  While here, she can benefit from crises stabilization, medication management, therapeutic milieu and referral for services.  Ida Rogueodney B Domingo Fuson. 09/23/2017

## 2017-09-23 NOTE — Progress Notes (Signed)
D: Pt denies SI/HI/AVH. Pt is pleasant and cooperative. Pt stated she was doing ok, pt said there was nothing wrong with her. Pt said she did not like the way the Abilify made her feel, so she did not want to take it anymore.   A: Pt was offered support and encouragement. Pt was given scheduled medication. Pt was encourage to attend groups. Q 15 minute checks were done for safety.   R:Pt attends groups and interacts well with peers and staff.  Pt receptive to treatment and safety maintained on unit.

## 2017-09-23 NOTE — Progress Notes (Signed)
Recreation Therapy Notes  Date: 4.9.19 Time: 10:00 a.m. Location: [address] Dayroom   Group Topic: Communication   Goal Area(s) Addresses:  Goal 1.1: To improve communication  - Group will communicate with peers during group session    - Group will identify the importance of healthy communication  - Group will answer at least three questions during Recreation Therapy tx  Behavioral Response: Engaged   Intervention: Game   Activity: Jenga: Patients played Jenga as normal. Once a patient pulled out a [place] piece, based on the color of the skittle, the patient answered a specific question for that color.   Education: Communication, Team-Work   Education Outcome: Acknowledges education  Clinical Observations/Feedback: Patient attended and participated appropriately in Recreation Therapy group treatment successfully engaging in group activity. Patient was able to communicate with peers in a appropriate manner. Patient was able to identify the importance of communication.  Patient was able to answer at least three questions about communication during Recreation Therapy Group treatment. Patient successfully met Goal 1.1 (see above).   Ranell Patrick, Recreation Therapy Intern   Ranell Patrick 09/23/2017 11:19 AM
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
## 2017-09-24 LAB — HEMOGLOBIN A1C
HEMOGLOBIN A1C: 4.4 % — AB (ref 4.8–5.6)
Mean Plasma Glucose: 79.58 mg/dL

## 2017-09-24 MED ORDER — ARIPIPRAZOLE 5 MG PO TABS
5.0000 mg | ORAL_TABLET | Freq: Every day | ORAL | Status: DC
  Administered 2017-09-25 – 2017-09-26 (×2): 5 mg via ORAL
  Filled 2017-09-24 (×3): qty 1

## 2017-09-24 NOTE — Progress Notes (Signed)
[facility] MD Progress Note  09/24/2017 10:18 AM Paige Mccarty  MRN:  [id number] Subjective:    Paige Mccarty is a 27 y/o F with no previous psychiatric history who was admitted voluntarily to [facility] after she walked in with report of worsening anxiety, depression, SI with plan, and decreased need for sleep with no sleep for 5 days prior to admission. Pt was started on trial of abilify to address symptoms of bipolar mania.  Today upon evaluation, pt shares, "I don't feel good. I'm tired, and that medicine gave me a headache." Pt reports feeling "slowed" compared to yesterday, and she has noticeable decrease in rate of her speech as she was pressured during interview yesterday. Pt denies SI/HI/AH/VH. Discussed with patient that she has tylenol available for headache. She notes that her mother came to visit last night, and the visit went well. She also shares that staff from her [place] office came to check on her, but she asked them to go as she felt that her hospital stay is private. Discussed with patient about diagnosis of bipolar disorder, and importance of staying on medications to prevent recurrence of manic episode, and she verbalized good understanding. Discussed with patient about reducing dose of abilify tomorrow AM to reduce daytime sedation, and pt was in agreement.  Principal Problem: Bipolar I disorder, most recent episode mixed ([place]) Diagnosis:   Patient Active Problem List   Diagnosis Date Noted  . Bipolar I disorder, most recent episode mixed ([place]) [F31.60] 09/23/2017  . Psychosis ([place]) [F29] 09/22/2017  . Endometrioma (left ovary) [N80.9] 08/22/2017  . Endometriosis determined by laparoscopy [N80.9] 08/22/2017  . Altered mental status [R41.82] 08/20/2017  . Left ovarian cyst [N83.202] 06/02/2017  . Pelvic pain [R10.2] 06/02/2017  . Nausea [R11.0] 06/02/2017  . Bacterial vaginitis [N76.0, B96.89] 06/02/2017   Total Time spent with patient: 30 minutes  Past Psychiatric History:  see H&P  Past Medical History:  Past Medical History:  Diagnosis Date  . Ovarian cyst     Past Surgical History:  Procedure Laterality Date  . LAPAROSCOPY Left 08/20/2017   Procedure: LAPAROSCOPY DIAGNOSTIC WITH DRAINAGE OF LEFT OVARIAN CYST;  Surgeon: Aletha Halim, MD;  Location: [facility];  Service: Gynecology;  Laterality: Left;  . WISDOM TOOTH EXTRACTION     local   Family History: No family history on file. Family Psychiatric  History: see H&P Social History:  Social History   Substance and Sexual Activity  Alcohol Use No  . Frequency: Never     Social History   Substance and Sexual Activity  Drug Use No    Social History   Socioeconomic History  . Marital status: Single    Spouse name: Not on file  . Number of children: Not on file  . Years of education: Not on file  . Highest education level: Not on file  Occupational History  . Not on file  Social Needs  . Financial resource strain: Not on file  . Food insecurity:    Worry: Not on file    Inability: Not on file  . Transportation needs:    Medical: Not on file    Non-medical: Not on file  Tobacco Use  . Smoking status: Never Smoker  . Smokeless tobacco: Never Used  Substance and Sexual Activity  . Alcohol use: No    Frequency: Never  . Drug use: No  . Sexual activity: Yes    Birth control/protection: Condom  Lifestyle  . Physical activity:    Days per  week: Not on file    Minutes per session: Not on file  . Stress: Not on file  Relationships  . Social connections:    Talks on phone: Not on file    Gets together: Not on file    Attends religious service: Not on file    Active member of club or organization: Not on file    Attends meetings of clubs or organizations: Not on file    Relationship status: Not on file  Other Topics Concern  . Not on file  Social History Narrative  . Not on file   Additional Social History:    Pain Medications: denies Prescriptions: denies Over the Counter:  denies History of alcohol / drug use?: No history of alcohol / drug abuse                    Sleep: Good  Appetite:  Good  Current Medications: Current Facility-Administered Medications  Medication Dose Route Frequency Provider Last Rate Last Dose  . acetaminophen (TYLENOL) tablet 650 mg  650 mg Oral Q6H PRN Niel Hummer, NP      . alum & mag hydroxide-simeth (MAALOX/MYLANTA) 200-200-20 MG/5ML suspension 30 mL  30 mL Oral Q4H PRN Elmarie Shiley A, NP      . ARIPiprazole (ABILIFY) tablet 10 mg  10 mg Oral Daily Pennelope Bracken, MD   10 mg at 09/24/17 0742  . feeding supplement (ENSURE ENLIVE) (ENSURE ENLIVE) liquid 237 mL  237 mL Oral BID BM Pennelope Bracken, MD   237 mL at 09/23/17 1534  . hydrOXYzine (ATARAX/VISTARIL) tablet 25 mg  25 mg Oral Q6H PRN Pennelope Bracken, MD      . magnesium hydroxide (MILK OF MAGNESIA) suspension 30 mL  30 mL Oral Daily PRN Elmarie Shiley A, NP      . norethindrone (AYGESTIN) tablet 5 mg  5 mg Oral QHS Pennelope Bracken, MD   5 mg at 09/23/17 2133  . OLANZapine zydis (ZYPREXA) disintegrating tablet 5 mg  5 mg Oral Q8H PRN Niel Hummer, NP      . traZODone (DESYREL) tablet 50 mg  50 mg Oral QHS PRN Niel Hummer, NP        Lab Results:  Results for orders placed or performed during the hospital encounter of 09/22/17 (from the past 48 hour(s))  Urinalysis, Routine w reflex microscopic     Status: Abnormal   Collection Time: 09/23/17 10:09 AM  Result Value Ref Range   Color, Urine YELLOW YELLOW   APPearance HAZY (A) CLEAR   Specific Gravity, Urine 1.015 1.005 - 1.030   pH 8.0 5.0 - 8.0   Glucose, UA NEGATIVE NEGATIVE mg/dL   Hgb urine dipstick NEGATIVE NEGATIVE   Bilirubin Urine NEGATIVE NEGATIVE   Ketones, ur 5 (A) NEGATIVE mg/dL   Protein, ur NEGATIVE NEGATIVE mg/dL   Nitrite NEGATIVE NEGATIVE   Leukocytes, UA LARGE (A) NEGATIVE   RBC / HPF 0-5 0 - 5 RBC/hpf   WBC, UA 0-5 0 - 5 WBC/hpf   Bacteria, UA RARE  (A) NONE SEEN   Squamous Epithelial / LPF 6-30 (A) NONE SEEN   Mucus PRESENT     Comment: Performed at [facility], [place] [address], [place], [place] [address]  Pregnancy, urine     Status: None   Collection Time: 09/23/17 12:06 PM  Result Value Ref Range   Preg Test, Ur NEGATIVE NEGATIVE    Comment:  THE SENSITIVITY OF THIS METHODOLOGY IS >20 mIU/mL. Performed at [facility], [place] [address] [place], [place] [address]   Urine rapid drug screen (hosp performed)not at [facility]     Status: None   Collection Time: 09/23/17 12:06 PM  Result Value Ref Range   Opiates NONE DETECTED NONE DETECTED   Cocaine NONE DETECTED NONE DETECTED   Benzodiazepines NONE DETECTED NONE DETECTED   Amphetamines NONE DETECTED NONE DETECTED   Tetrahydrocannabinol NONE DETECTED NONE DETECTED   Barbiturates NONE DETECTED NONE DETECTED    Comment: (NOTE) DRUG SCREEN FOR MEDICAL PURPOSES ONLY.  IF CONFIRMATION IS NEEDED FOR ANY PURPOSE, NOTIFY LAB WITHIN 5 DAYS. LOWEST DETECTABLE LIMITS FOR URINE DRUG SCREEN Drug Class                     Cutoff (ng/mL) Amphetamine and metabolites    1000 Barbiturate and metabolites    200 Benzodiazepine                 676 Tricyclics and metabolites     300 Opiates and metabolites        300 Cocaine and metabolites        300 THC                            50 Performed at [facility], [place] [address] [place], [place] [address]   CBC     Status: None   Collection Time: 09/23/17  6:27 PM  Result Value Ref Range   WBC 8.2 4.0 - 10.5 K/uL   RBC 4.54 3.87 - 5.11 MIL/uL   Hemoglobin 14.0 12.0 - 15.0 g/dL   HCT 39.7 36.0 - 46.0 %   MCV 87.4 78.0 - 100.0 fL   MCH 30.8 26.0 - 34.0 pg   MCHC 35.3 30.0 - 36.0 g/dL   RDW 12.5 11.5 - 15.5 %   Platelets 279 150 - 400 K/uL    Comment: Performed at [facility], [place] [address], [place], [place] [address]  Comprehensive metabolic panel     Status:  Abnormal   Collection Time: 09/23/17  6:27 PM  Result Value Ref Range   Sodium 138 135 - 145 mmol/L   Potassium 4.2 3.5 - 5.1 mmol/L   Chloride 104 101 - 111 mmol/L   CO2 25 22 - 32 mmol/L   Glucose, Bld 77 65 - 99 mg/dL   BUN 12 6 - 20 mg/dL   Creatinine, Ser 0.93 0.44 - 1.00 mg/dL   Calcium 9.4 8.9 - 10.3 mg/dL   Total Protein 7.8 6.5 - 8.1 g/dL   Albumin 4.0 3.5 - 5.0 g/dL   AST 15 15 - 41 U/L   ALT 10 (L) 14 - 54 U/L   Alkaline Phosphatase 64 38 - 126 U/L   Total Bilirubin 0.5 0.3 - 1.2 mg/dL   GFR calc non Af Amer >60 >60 mL/min   GFR calc Af Amer >60 >60 mL/min    Comment: (NOTE) The eGFR has been calculated using the CKD EPI equation. This calculation has not been validated in all clinical situations. eGFR's persistently <60 mL/min signify possible Chronic Kidney Disease.    Anion gap 9 5 - 15    Comment: Performed at [facility], [place] [address] [place],  [address]  Ethanol     Status: None   Collection Time: 09/23/17  6:27 PM  Result  Value Ref Range   Alcohol, Ethyl (B) <10 <10 mg/dL    Comment:        LOWEST DETECTABLE LIMIT FOR SERUM ALCOHOL IS 10 mg/dL FOR MEDICAL PURPOSES ONLY Performed at [place] [address], [place], [place] [address]   TSH     Status: None   Collection Time: 09/23/17  6:27 PM  Result Value Ref Range   TSH 1.559 0.350 - 4.500 uIU/mL    Comment: Performed by a 3rd Generation assay with a functional sensitivity of <=0.01 uIU/mL. Performed at [facility], [place] [address], [place], [place] [address]     Blood Alcohol level:  Lab Results  Component Value Date   ETH <10 29/92/4268    Metabolic Disorder Labs: No results found for: HGBA1C, MPG No results found for: PROLACTIN No results found for: CHOL, TRIG, HDL, CHOLHDL, VLDL, LDLCALC  Physical Findings: AIMS: Facial and Oral Movements Muscles of Facial Expression: None, normal Lips and Perioral Area: None,  normal Jaw: None, normal Tongue: None, normal,Extremity Movements Upper (arms, wrists, hands, fingers): None, normal Lower (legs, knees, ankles, toes): None, normal, Trunk Movements Neck, shoulders, hips: None, normal, Overall Severity Severity of abnormal movements (highest score from questions above): None, normal Incapacitation due to abnormal movements: None, normal Patient's awareness of abnormal movements (rate only patient's report): No Awareness, Dental Status Current problems with teeth and/or dentures?: No Does patient usually wear dentures?: No  CIWA:    COWS:     Musculoskeletal: Strength & Muscle Tone: within normal limits Gait & Station: normal Patient leans: N/A  Psychiatric Specialty Exam: Physical Exam  Nursing note and vitals reviewed.   Review of Systems  Constitutional: Positive for malaise/fatigue. Negative for chills and fever.  Respiratory: Negative for cough and shortness of breath.   Cardiovascular: Negative for chest pain.  Gastrointestinal: Negative for abdominal pain, heartburn, nausea and vomiting.  Neurological: Positive for headaches.  Psychiatric/Behavioral: Negative for depression, hallucinations and suicidal ideas. The patient is not nervous/anxious and does not have insomnia.     Blood pressure 112/82, pulse (!) 101, temperature 98.1 F (36.7 C), temperature source Oral, resp. rate 16, height 5' 4"  (1.626 m), weight 53.1 kg (117 lb), last menstrual period 09/09/2017, SpO2 98 %.Body mass index is 20.08 kg/m.  General Appearance: Casual and Fairly Groomed  Eye Contact:  Good  Speech:  Clear and Coherent and Normal Rate  Volume:  Normal  Mood:  Euthymic  Affect:  Blunt  Thought Process:  Coherent and Goal Directed  Orientation:  Full (Time, Place, and Person)  Thought Content:  Logical  Suicidal Thoughts:  No  Homicidal Thoughts:  No  Memory:  Immediate;   Fair Recent;   Fair Remote;   Fair  Judgement:  Fair  Insight:  Fair   Psychomotor Activity:  Normal  Concentration:  Concentration: Fair  Recall:  [employer] of Knowledge:  Fair  Language:  Fair  Akathisia:  No  Handed:    AIMS (if indicated):     Assets:  Communication Skills Resilience Social Support  ADL's:  Intact  Cognition:  WNL  Sleep:  Number of Hours: 6   Treatment Plan Summary: Daily contact with patient to assess and evaluate symptoms and progress in treatment and Medication management   -Admit to inpatient level of care  -Bipolar I, current episode mixed, without psychotic features             - Change abilify 10m po qday  to abilify 52m po qDay  -Anxiety             - Continue atarax 239mpo q6h prn anxiety  -Insomnia             - Continue trazodone 5021mo qhs prn insomnia  -Agitation/psychosis             - Continue zydis 5mg25m q8h prn agitation/psychosis  -Endometriosis             - Continue norethindrone 5mg 66mqhs  -Encourage participation in groups and therapeutic milieu  -Disposition planning will be ongoing  ChrisPennelope Bracken4/03/2018, 10:18 AM

## 2017-09-24 NOTE — Progress Notes (Signed)
Patient denies SI, HI and AVH.  Patient has been calm and cooperative on unit attended groups and engaged in unit activities.   Assess patient for safety, offer medications as prescribed, and engage patient in 1:1 staff talk.   Continue to monitor as planned.  Patient able to contract for safety.         

## 2017-09-24 NOTE — Progress Notes (Signed)
Pt in room and in bed and awake.  Pt is calm and pleasant and answers assessment questions.  Pt denies SI, HI and AVH.  Pt contracts for safety. Pt attends group therapy and participates. Medication compliant.   Pt monitored for safety and remains safe on unit.

## 2017-09-24 NOTE — BHH Group Notes (Signed)
LCSW Group Therapy Note   09/24/2017 1:15pm   Type of Therapy and Topic:  Group Therapy:  Trust and Honesty  Participation Level:  Active  Description of Group:    In this group patients will be asked to explore the value of being honest.  Patients will be guided to discuss their thoughts, feelings, and behaviors related to honesty and trusting in others. Patients will process together how trust and honesty relate to forming relationships with peers, family members, and self. Each patient will be challenged to identify and express feelings of being vulnerable. Patients will discuss reasons why people are dishonest and identify alternative outcomes if one was truthful (to self or others). This group will be process-oriented, with patients participating in exploration of their own experiences, giving and receiving support, and processing challenge from other group members.   Therapeutic Goals: 1. Patient will identify why honesty is important to relationships and how honesty overall affects relationships.  2. Patient will identify a situation where they lied or were lied too and the  feelings, thought process, and behaviors surrounding the situation 3. Patient will identify the meaning of being vulnerable, how that feels, and how that correlates to being honest with self and others. 4. Patient will identify situations where they could have told the truth, but instead lied and explain reasons of dishonesty.   Summary of Patient Progress  My goal is to get better sleep.  My plan is to have a better work/life balance And leave my computer at work." Stated that her plan was to go to her follow up appointment which will help with staying on track, or if she slips.  Therapeutic Modalities:   Cognitive Behavioral Therapy Solution Focused Therapy Motivational Interviewing Brief Therapy  Ida RogueRodney B Rohin Krejci, LCSW 09/24/2017 12:29 PM

## 2017-09-24 NOTE — Tx Team (Signed)
Interdisciplinary Treatment and Diagnostic Plan Update  09/24/2017 Time of Session: 12:15 PM  [place] MRN: [id number]  Principal Diagnosis: Bipolar I disorder, most recent episode mixed ([place])  Secondary Diagnoses: Principal Problem:   Bipolar I disorder, most recent episode mixed ([place]) Active Problems:   Psychosis ([place])   Current Medications:  Current Facility-Administered Medications  Medication Dose Route Frequency Provider Last Rate Last Dose  . acetaminophen (TYLENOL) tablet 650 mg  650 mg Oral Q6H PRN Niel Hummer, NP      . alum & mag hydroxide-simeth (MAALOX/MYLANTA) 200-200-20 MG/5ML suspension 30 mL  30 mL Oral Q4H PRN Elmarie Shiley A, NP      . ARIPiprazole (ABILIFY) tablet 10 mg  10 mg Oral Daily Pennelope Bracken, MD   10 mg at 09/24/17 0742  . feeding supplement (ENSURE ENLIVE) (ENSURE ENLIVE) liquid 237 mL  237 mL Oral BID BM Pennelope Bracken, MD   237 mL at 09/23/17 1534  . hydrOXYzine (ATARAX/VISTARIL) tablet 25 mg  25 mg Oral Q6H PRN Pennelope Bracken, MD      . magnesium hydroxide (MILK OF MAGNESIA) suspension 30 mL  30 mL Oral Daily PRN Elmarie Shiley A, NP      . norethindrone (AYGESTIN) tablet 5 mg  5 mg Oral QHS Pennelope Bracken, MD   5 mg at 09/23/17 2133  . OLANZapine zydis (ZYPREXA) disintegrating tablet 5 mg  5 mg Oral Q8H PRN Niel Hummer, NP      . traZODone (DESYREL) tablet 50 mg  50 mg Oral QHS PRN Niel Hummer, NP        PTA Medications: Medications Prior to Admission  Medication Sig Dispense Refill Last Dose  . norethindrone (AYGESTIN) 5 MG tablet Take 1 tablet (5 mg total) by mouth daily. 60 tablet 6     Patient Stressors:    Patient Strengths:    Treatment Modalities: Medication Management, Group therapy, Case management,  1 to 1 session with clinician, Psychoeducation, Recreational therapy.   Physician Treatment Plan for Primary Diagnosis: Bipolar I disorder, most recent episode mixed  ([place]) Long Term Goal(s): Improvement in symptoms so as ready for discharge  Short Term Goals: Ability to verbalize feelings will improve Ability to demonstrate self-control will improve Ability to identify and develop effective coping behaviors will improve Ability to maintain clinical measurements within normal limits will improve Compliance with prescribed medications will improve  Medication Management: Evaluate patient's response, side effects, and tolerance of medication regimen.  Therapeutic Interventions: 1 to 1 sessions, Unit Group sessions and Medication administration.  Evaluation of Outcomes: Progressing  Physician Treatment Plan for Secondary Diagnosis: Principal Problem:   Bipolar I disorder, most recent episode mixed ([place]) Active Problems:   Psychosis ([place])   Long Term Goal(s): Improvement in symptoms so as ready for discharge  Short Term Goals: Ability to verbalize feelings will improve Ability to demonstrate self-control will improve Ability to identify and develop effective coping behaviors will improve Ability to maintain clinical measurements within normal limits will improve Compliance with prescribed medications will improve  Medication Management: Evaluate patient's response, side effects, and tolerance of medication regimen.  Therapeutic Interventions: 1 to 1 sessions, Unit Group sessions and Medication administration.  Evaluation of Outcomes: Progressing   RN Treatment Plan for Primary Diagnosis: Bipolar I disorder, most recent episode mixed ([place]) Long Term Goal(s): Knowledge of disease and therapeutic regimen to maintain health will improve  Short Term Goals: Ability to identify and develop effective coping behaviors  will improve and Compliance with prescribed medications will improve  Medication Management: RN will administer medications as ordered by provider, will assess and evaluate patient's response and provide education to patient for  prescribed medication. RN will report any adverse and/or side effects to prescribing provider.  Therapeutic Interventions: 1 on 1 counseling sessions, Psychoeducation, Medication administration, Evaluate responses to treatment, Monitor vital signs and CBGs as ordered, Perform/monitor CIWA, COWS, AIMS and Fall Risk screenings as ordered, Perform wound care treatments as ordered.  Evaluation of Outcomes: Progressing   LCSW Treatment Plan for Primary Diagnosis: Bipolar I disorder, most recent episode mixed ([place]) Long Term Goal(s): Safe transition to appropriate next level of care at discharge, Engage patient in therapeutic group addressing interpersonal concerns.  Short Term Goals: Engage patient in aftercare planning with referrals and resources  Therapeutic Interventions: Assess for all discharge needs, 1 to 1 time with Social worker, Explore available resources and support systems, Assess for adequacy in community support network, Educate family and significant other(s) on suicide prevention, Complete Psychosocial Assessment, Interpersonal group therapy.  Evaluation of Outcomes: Met  Return home, follow up Neuropsychiatric   Progress in Treatment: Attending groups: Yes Participating in groups: Yes Taking medication as prescribed: Yes Toleration medication: Yes, no side effects reported at this time Family/Significant other contact made: No  Pt refused Patient understands diagnosis: No  Limited insight Discussing patient identified problems/goals with staff: Yes Medical problems stabilized or resolved: Yes Denies suicidal/homicidal ideation: Yes Issues/concerns per patient self-inventory: None Other: N/A  New problem(s) identified: None identified at this time.   New Short Term/Long Term Goal(s): "I need help with sleep and coping "   Discharge Plan or Barriers:   Reason for Continuation of Hospitalization: Psychosis  Depression  Mania  Medication stabilization   Estimated  Length of Stay: 4/15  Attendees: Patient: Paige Mccarty 09/24/2017  12:15 PM  Physician: Maris Berger, MD 09/24/2017  12:15 PM  Nursing: Elesa Massed, RN 09/24/2017  12:15 PM  RN Care Manager: Lars Pinks, RN 09/24/2017  12:15 PM  Social Worker: Ripley Fraise 09/24/2017  12:15 PM  Recreational Therapist: Winfield Cunas 09/24/2017  12:15 PM  Other: Norberto Sorenson 09/24/2017  12:15 PM  Other:  09/24/2017  12:15 PM    Scribe for Treatment Team:  Roque Lias LCSW 09/24/2017 12:15 PM

## 2017-09-24 NOTE — Progress Notes (Signed)
Recreation Therapy Notes  INPATIENT RECREATION THERAPY ASSESSMENT  Patient Details Name: Carmin RichmondKenyatta Denise Dise MRN: [id number] DOB: 21-Jan-1991 Today's Date: 09/24/2017       Information Obtained From: Patient  Able to Participate in Assessment/Interview: Yes  Patient Presentation: Responsive, Hyperverbal  Reason for Admission (Per Patient): Patient reports being admitted into the hospital due to the lack of sleep from overworking   Patient Stressors: Work   Coping Skills:   Film/video editorsolation, Music, Art, Read, Dance  Leisure Interests (2+):  Hanging with sisters   Frequency of Recreation/Participation: Weekly  Awareness of Community Resources:  Yes  Community Resources:  [place], [place], Engineer, petroleumGym, Research scientist (physical sciences)Movie Theaters  Current Use: Yes  Expressed Interest in [employer] CorporationCommunity Resource Information: No  Enbridge EnergyCounty of Residence:  [place]er, technical sales[place]  Patient Main Form of Transportation: Set designerCar  Patient Strengths:  Working  Patient Identified Areas of Improvement:  "sleep"  Patient Goal for Hospitalization:  "To sleep"   Current SI (including self-harm):  No  Current HI:  No  Current AVH: No  Staff Intervention Plan: Group Attendance, Collaborate with Interdisciplinary Treatment Team  Consent to Intern Participation: Yes  Sheryle Hailarian Aydrien Froman, Recreation Therapy Intern   Sheryle HailDarian Seraj Dunnam 09/24/2017, 8:22 AM

## 2017-09-24 NOTE — Progress Notes (Signed)
Recreation Therapy Notes  Date: 4.10.19 Time: 10:00 a.m.  Location: 500 [address] Dayroom    Group Topic: Goal Setting, Positivity    Goal Area(s) Addresses:  Goal 1.1: Patients will identify different skills they use to create a positive life style  Patient will identify at least one goal for a positive lifestyle   Patient will identify the importance of a positive lifestyle   Patient will participate in Recreation Therapy group tx.    Behavioral Response: Appropriate    Intervention: Arts and Crafts    Activity: For the first part of the activity, patients were asked to try to balance a plastic egg on a flat surface. Patients realized that the egg was not be able to stand on its own without any support. Recreation Therapy Intern then passed out a cup of salt to the patients and ask them to try the task again. Having the patient resort to using salt illustrates that many things are possible but may require some outside-the-box thinking to achieve, just like with goals. After processing with patients, they had the opportunity to decorate their egg using the paint provided.    Education: Goal Setting, Positivity    Education Outcome: Acknowledges Education   Clinical Observations/Feedback: Patient attended and participated appropriately during Recreation Therapy group treatment successfully identifying at least one goal for a positive lifestyle. Patient communicated with peers in a appropriate manner. Patient was able to identify the importance of having a positive lifestyle. Patient actively listened during opening and closing discussion. Patient successfully met Goal 1.1 (See Above).    Ranell Patrick, Recreation Therapy Intern   Ranell Patrick 09/24/2017 11:32 AM

## 2017-09-25 NOTE — Progress Notes (Signed)
[facility] MD Progress Note  09/25/2017 5:05 PM Paige Mccarty  MRN:  [id number] Subjective:    Paige Mccarty is a 27 y/o F with no previous psychiatric history who was admitted voluntarily to [facility] after she walked in with report of worsening anxiety, depression, SI with plan, and decreased need for sleep with no sleep for 5 days prior to admission. Pt was started on trial of abilify to address symptoms of bipolar mania. Pt has been reporting incremental improvement of her presenting symptoms including improved sleep. She noted some daytime sedation with abilify, so her dose was reduced yesterday to 27m qDay.  Today upon evaluation, pt shares, "I'm doing better. My sleep is better. I don't feel so sleepy today." She reports that her mood is doing well. Her appetite is good. She denies other specific concerns. She denies other physical complaints. She denies SI/HI/AH/VH. She has been in contact with her family who have been supportive of her. She is in agreement to continue her current treatment regimen without changes. She had no further questions, comments, or concerns.  Principal Problem: Bipolar I disorder, most recent episode mixed ([place] Diagnosis:   Patient Active Problem List   Diagnosis Date Noted  . Bipolar I disorder, most recent episode mixed ([place] [F31.60] 09/23/2017  . Psychosis ([place] [F29] 09/22/2017  . Endometrioma (left ovary) [N80.9] 08/22/2017  . Endometriosis determined by laparoscopy [N80.9] 08/22/2017  . Altered mental status [R41.82] 08/20/2017  . Left ovarian cyst [N83.202] 06/02/2017  . Pelvic pain [R10.2] 06/02/2017  . Nausea [R11.0] 06/02/2017  . Bacterial vaginitis [N76.0, B96.89] 06/02/2017   Total Time spent with patient: 30 minutes  Past Psychiatric History: see H&P  Past Medical History:  Past Medical History:  Diagnosis Date  . Ovarian cyst     Past Surgical History:  Procedure Laterality Date  . LAPAROSCOPY Left 08/20/2017   Procedure: LAPAROSCOPY  DIAGNOSTIC WITH DRAINAGE OF LEFT OVARIAN CYST;  Surgeon: PAletha Halim MD;  [facility];  Service: Gynecology;  Laterality: Left;  . WISDOM TOOTH EXTRACTION     local   Family History: No family history on file. Family Psychiatric  History: see H&P Social History:  Social History   Substance and Sexual Activity  Alcohol Use No  . Frequency: Never     Social History   Substance and Sexual Activity  Drug Use No    Social History   Socioeconomic History  . Marital status: Single    Spouse name: Not on file  . Number of children: Not on file  . Years of education: Not on file  . Highest education level: Not on file  Occupational History  . Not on file  Social Needs  . Financial resource strain: Not on file  . Food insecurity:    Worry: Not on file    Inability: Not on file  . Transportation needs:    Medical: Not on file    Non-medical: Not on file  Tobacco Use  . Smoking status: Never Smoker  . Smokeless tobacco: Never Used  Substance and Sexual Activity  . Alcohol use: No    Frequency: Never  . Drug use: No  . Sexual activity: Yes    Birth control/protection: Condom  Lifestyle  . Physical activity:    Days per week: Not on file    Minutes per session: Not on file  . Stress: Not on file  Relationships  . Social connections:    Talks on phone: Not on file  Gets together: Not on file    Attends religious service: Not on file    Active member of club or organization: Not on file    Attends meetings of clubs or organizations: Not on file    Relationship status: Not on file  Other Topics Concern  . Not on file  Social History Narrative  . Not on file   Additional Social History:    Pain Medications: denies Prescriptions: denies Over the Counter: denies History of alcohol / drug use?: No history of alcohol / drug abuse                    Sleep: Good  Appetite:  Good  Current Medications: Current Facility-Administered Medications   Medication Dose Route Frequency Provider Last Rate Last Dose  . acetaminophen (TYLENOL) tablet 650 mg  650 mg Oral Q6H PRN Niel Hummer, NP      . alum & mag hydroxide-simeth (MAALOX/MYLANTA) 200-200-20 MG/5ML suspension 30 mL  30 mL Oral Q4H PRN Elmarie Shiley A, NP      . ARIPiprazole (ABILIFY) tablet 5 mg  5 mg Oral Daily Pennelope Bracken, MD   5 mg at 09/25/17 0946  . feeding supplement (ENSURE ENLIVE) (ENSURE ENLIVE) liquid 237 mL  237 mL Oral BID BM Pennelope Bracken, MD   237 mL at 09/25/17 0945  . hydrOXYzine (ATARAX/VISTARIL) tablet 25 mg  25 mg Oral Q6H PRN Pennelope Bracken, MD      . magnesium hydroxide (MILK OF MAGNESIA) suspension 30 mL  30 mL Oral Daily PRN Elmarie Shiley A, NP      . norethindrone (AYGESTIN) tablet 5 mg  5 mg Oral QHS Pennelope Bracken, MD   5 mg at 09/24/17 2130  . OLANZapine zydis (ZYPREXA) disintegrating tablet 5 mg  5 mg Oral Q8H PRN Niel Hummer, NP      . traZODone (DESYREL) tablet 50 mg  50 mg Oral QHS PRN Niel Hummer, NP        Lab Results:  Results for orders placed or performed during the hospital encounter of 09/22/17 (from the past 48 hour(s))  CBC     Status: None   Collection Time: 09/23/17  6:27 PM  Result Value Ref Range   WBC 8.2 4.0 - 10.5 K/uL   RBC 4.54 3.87 - 5.11 MIL/uL   Hemoglobin 14.0 12.0 - 15.0 g/dL   HCT 39.7 36.0 - 46.0 %   MCV 87.4 78.0 - 100.0 fL   MCH 30.8 26.0 - 34.0 pg   MCHC 35.3 30.0 - 36.0 g/dL   RDW 12.5 11.5 - 15.5 %   Platelets 279 150 - 400 K/uL    Comment: Performed at [facility], [place] [address], [place], [place] [address]  Comprehensive metabolic panel     Status: Abnormal   Collection Time: 09/23/17  6:27 PM  Result Value Ref Range   Sodium 138 135 - 145 mmol/L   Potassium 4.2 3.5 - 5.1 mmol/L   Chloride 104 101 - 111 mmol/L   CO2 25 22 - 32 mmol/L   Glucose, Bld 77 65 - 99 mg/dL   BUN 12 6 - 20 mg/dL   Creatinine, Ser 0.93 0.44 - 1.00 mg/dL    Calcium 9.4 8.9 - 10.3 mg/dL   Total Protein 7.8 6.5 - 8.1 g/dL   Albumin 4.0 3.5 - 5.0 g/dL   AST 15 15 - 41 U/L   ALT 10 (L) 14 -  54 U/L   Alkaline Phosphatase 64 38 - 126 U/L   Total Bilirubin 0.5 0.3 - 1.2 mg/dL   GFR calc non Af Amer >60 >60 mL/min   GFR calc Af Amer >60 >60 mL/min    Comment: (NOTE) The eGFR has been calculated using the CKD EPI equation. This calculation has not been validated in all clinical situations. eGFR's persistently <60 mL/min signify possible Chronic Kidney Disease.    Anion gap 9 5 - 15    Comment: Performed at [facility], [place] [address], [place], [place] [address]  Ethanol     Status: None   Collection Time: 09/23/17  6:27 PM  Result Value Ref Range   Alcohol, Ethyl (B) <10 <10 mg/dL    Comment:        LOWEST DETECTABLE LIMIT FOR SERUM ALCOHOL IS 10 mg/dL FOR MEDICAL PURPOSES ONLY Performed at [facility], [place] [address] [place], [place] [address]   TSH     Status: None   Collection Time: 09/23/17  6:27 PM  Result Value Ref Range   TSH 1.559 0.350 - 4.500 uIU/mL    Comment: Performed by a 3rd Generation assay with a functional sensitivity of <=0.01 uIU/mL. Performed at [facility], [place] [address] [place], [place] [address]   Hemoglobin A1c     Status: Abnormal   Collection Time: 09/24/17  6:42 AM  Result Value Ref Range   Hgb A1c MFr Bld 4.4 (L) 4.8 - 5.6 %    Comment: (NOTE) Pre diabetes:          5.7%-6.4% Diabetes:              >6.4% Glycemic control for   <7.0% adults with diabetes    Mean Plasma Glucose 79.58 mg/dL    Comment: Performed at [facility] [address] [place], [place] [address]    Blood Alcohol level:  Lab Results  Component Value Date   ETH <10 51/88/4166    Metabolic Disorder Labs: Lab Results  Component Value Date   HGBA1C 4.4 (L) 09/24/2017   MPG 79.58 09/24/2017   No results found for: PROLACTIN No results found for: CHOL,  TRIG, HDL, CHOLHDL, VLDL, LDLCALC  Physical Findings: AIMS: Facial and Oral Movements Muscles of Facial Expression: None, normal Lips and Perioral Area: None, normal Jaw: None, normal Tongue: None, normal,Extremity Movements Upper (arms, wrists, hands, fingers): None, normal Lower (legs, knees, ankles, toes): None, normal, Trunk Movements Neck, shoulders, hips: None, normal, Overall Severity Severity of abnormal movements (highest score from questions above): None, normal Incapacitation due to abnormal movements: None, normal Patient's awareness of abnormal movements (rate only patient's report): No Awareness, Dental Status Current problems with teeth and/or dentures?: No Does patient usually wear dentures?: No  CIWA:    COWS:     Musculoskeletal: Strength & Muscle Tone: within normal limits Gait & Station: normal Patient leans: N/A  Psychiatric Specialty Exam: Physical Exam  Nursing note and vitals reviewed.   Review of Systems  Constitutional: Negative for chills and fever.  Respiratory: Negative for cough and shortness of breath.   Cardiovascular: Negative for chest pain.  Gastrointestinal: Negative for abdominal pain, heartburn, nausea and vomiting.  Psychiatric/Behavioral: Negative for depression, hallucinations and suicidal ideas. The patient is not nervous/anxious and does not have insomnia.     Blood pressure 122/74, pulse (!) 102, temperature 98.8 F (37.1 C), temperature source Oral, resp. rate 16, height 5' 4"  (1.626 m), weight  53.1 kg (117 lb), last menstrual period 09/09/2017, SpO2 98 %.Body mass index is 20.08 kg/m.  General Appearance: Casual and Fairly Groomed  Eye Contact:  Good  Speech:  Clear and Coherent and Normal Rate  Volume:  Normal  Mood:  Euthymic  Affect:  Appropriate, Congruent and Constricted  Thought Process:  Coherent and Goal Directed  Orientation:  Full (Time, Place, and Person)  Thought Content:  Logical  Suicidal Thoughts:  No   Homicidal Thoughts:  No  Memory:  Immediate;   Fair Recent;   Fair Remote;   Fair  Judgement:  Fair  Insight:  Fair  Psychomotor Activity:  Normal  Concentration:  Concentration: Fair  Recall:  [employer] of Knowledge:  Fair  Language:  Fair  Akathisia:  No  Handed:    AIMS (if indicated):     Assets:  Communication Skills Desire for Improvement Financial Resources/Insurance Housing Physical Health Resilience Social Support  ADL's:  Intact  Cognition:  WNL  Sleep:  Number of Hours: 6.75   Treatment Plan Summary: Daily contact with patient to assess and evaluate symptoms and progress in treatment and Medication management   -Continue inpatient hospitalization  -Bipolar I, current episode mixed, without psychotic features - Continue abilify 45m po qDay  -Anxiety - Continue atarax 256mpo q6h prn anxiety  -Insomnia - Continue trazodone 5063mo qhs prn insomnia  -Agitation/psychosis - Continue zydis 5mg44m q8h prn agitation/psychosis  -Endometriosis - Continue norethindrone 5mg 26mqhs  -Encourage participation in groups and therapeutic milieu  -Disposition planning will be ongoing  ChrisPennelope Bracken4/04/2018, 5:05 PM

## 2017-09-25 NOTE — Progress Notes (Signed)
Patient attended wrap-up group and rated her day an 8. Her goal is to continue to progress and prepare for discharge.

## 2017-09-25 NOTE — Progress Notes (Signed)
Patient ID: Paige Mccarty, female   DOB: 1990/09/29, 27 y.o.   MRN: [id number]  D: Patient observed watching TV in dayroom on approach. Pt presented with depressed mood and flat affect. Pt reports she had a good day and looking forward to possible discharge tomorrow. Pt reports feeling anxious about returning to her "stressful job and dealing with coworkers". Pt denies  SI/HI/AVH and pain. No behavioral issues noted.  A: Support and encouragement offered as needed to express needs. Medications administered as prescribed.  R: Patient is safe and cooperative on unit. Pt attended and engaged in evening wrap up group.

## 2017-09-25 NOTE — Progress Notes (Signed)
Recreation Therapy Notes  Date: 4.11.19 Time: 10:00 a.m.  Location: [address] Dayroom   Group Topic: Self-Awareness   Goal Area(s) Addresses:  Goal 1.1: To increase self-awareness   - Patient will identify the importance of self-awareness  - Patient will identify at least three things of how others view them  - Patient will identify at least three things of how they view themselves   Behavioral Response: Appropriate   Intervention: Craft   Activity: Patients were instructed to decorate the outside of their bag using the magazines provided to represent how other people see them and put things inside that represent how they really are on the inside. At the end of the session, patients shared their bags with one another.  Education: Chief Executive Officer Education  Clinical Observations/Feedback: Patient attended and participated appropriately during Recreation Therapy group treatment successfully identifying the importance of self awareness. Patient was able to identify at least three things of how others view them. Patient was able to identify at least three things of how they view themselves. Patient presented bags in front of peers. Patient participated during opening and closing discussion. Patient successfully met Goal 1.1 (See Above).   Ranell Patrick, Recreation Therapy Intern   Ranell Patrick 09/25/2017 12:54 PM

## 2017-09-25 NOTE — Progress Notes (Signed)
Patient denies SI, HI and AVH.  Patient has been calm and cooperative on unit attended groups and engaged in unit activities.   Assess patient for safety, offer medications as prescribed, and engage patient in 1:1 staff talk.   Continue to monitor as planned.  Patient able to contract for safety.

## 2017-09-25 NOTE — Progress Notes (Signed)
Patient states that she had a "great day" and that she anticipates being discharged tomorrow. Her goal for tomorrow is to get ready for discharge.

## 2017-09-25 NOTE — [facility] Group Notes (Signed)
[facility] LCSW Group Therapy 09/25/2017 1:15pm  Type of Therapy: Group Therapy- Feelings Around Discharge & Establishing a Supportive Framework  Participation Level:  Active  Description of Group:   What is a supportive framework? What does it look like feel like and how do I discern it from and unhealthy non-supportive network? Learn how to cope when supports are not helpful and don't support you. Discuss what to do when your family/friends are not supportive.  Summary of Patient Progress  Skye came to group and remained there the entire time.  She shared that her little sisters give her the strength to keep going.    Therapeutic Modalities:   Cognitive Behavioral Therapy Person-Centered Therapy Motivational Interviewing   Aram Beecham, Student-Social Work 09/25/2017 2:29 PM

## 2017-09-26 MED ORDER — ARIPIPRAZOLE 5 MG PO TABS: 5.0000 mg | ORAL_TABLET | Freq: Every day | ORAL | 0 refills | Status: DC

## 2017-09-26 MED ORDER — ARIPIPRAZOLE 5 MG PO TABS: 5.0000 mg | ORAL_TABLET | Freq: Every day | ORAL | 0 refills | Status: AC

## 2017-09-26 MED ORDER — TRAZODONE HCL 50 MG PO TABS: 50.0000 mg | ORAL_TABLET | Freq: Every evening | ORAL | 0 refills | Status: AC | PRN

## 2017-09-26 MED ORDER — NORETHINDRONE ACETATE 5 MG PO TABS: 5.0000 mg | ORAL_TABLET | Freq: Every day | ORAL | 0 refills | Status: AC

## 2017-09-26 MED ORDER — HYDROXYZINE HCL 25 MG PO TABS: 25.0000 mg | ORAL_TABLET | Freq: Four times a day (QID) | ORAL | 0 refills | Status: AC | PRN

## 2017-09-26 NOTE — Progress Notes (Signed)
  [facility] :  Will you be returning to the same living situation after discharge:  Yes,  Home At discharge, do you have transportation home?: Yes,  Self  Do you have the ability to pay for your medications: Yes,  Insurance   Release of information consent forms completed and in the chart;  Patient's signature needed at discharge.  Patient to Follow up at: Follow-up Information    [facility] on 10/13/2017.   Why:  Monday at 9AM with Dr A for your hospital follow up appointment Contact information: [address] [place] [id number] [phone]           Next level of care provider [facility] Link:no  Safety Planning and Suicide Prevention discussed: Yes,  Yes  Have you used any form of tobacco in the last 30 days? (Cigarettes, Smokeless Tobacco, Cigars, and/or Pipes): No  Has patient been referred to the Quitline?: N/A patient is not a smoker  Patient has been referred for addiction treatment: N/A  Aram BeechamAngel M Mollyann Halbert, Student-Social Work 09/26/2017, 9:37 AM

## 2017-09-26 NOTE — Progress Notes (Signed)
Recreation Therapy Notes  INPATIENT RECREATION TR PLAN  Patient Details Name: Paige Mccarty MRN: [id number] DOB: 02/20/1991 Today's Date: 09/26/2017  Rec Therapy Plan Is patient appropriate for Therapeutic Recreation?: Yes Treatment times per week: At least three Estimated Length of Stay: 5-7 days  TR Treatment/Interventions: Group participation (Appropriate participation in Recreation Therapy group tx. )  Discharge Criteria Pt will be discharged from therapy if:: Discharged Treatment plan/goals/alternatives discussed and agreed upon by:: Patient/family  Discharge Summary Short term goals set: See Care Plan  Short term goals met: Complete Progress toward goals comments: Groups attended Which groups?: Goal setting, Communication, Stress management, Self-Awareness  Therapeutic equipment acquired: None  Reason patient discharged from therapy: Discharge from hospital Pt/family agrees with progress & goals achieved: Yes Date patient discharged from therapy: 09/26/17   , Recreation Therapy Intern     09/26/2017, 3:03 PM  

## 2017-09-26 NOTE — Discharge Summary (Addendum)
Physician Discharge Summary Note  Patient:  Carmin RichmondKenyatta Denise Hathaway is an 27 y.o., female  MRN:  [id number]  DOB:  [birth year]  Patient phone:  [phone] (home)   Patient address:   [address] [place] [address],   Total Time spent with patient: Greater than 30 minutes  Date of Admission:  09/22/2017  Date of Discharge: 09-26-17  Reason for Admission: Worsening anxiety, depression, SI with plan, and decreased need for sleepwith no sleep for 5 days prior.  Principal Problem: Bipolar I disorder, most recent episode mixed [facility](HCC)  Discharge Diagnoses: Patient Active Problem List   Diagnosis Date Noted  . Bipolar I disorder, most recent episode mixed (HCC) [F31.60] 09/23/2017  . Psychosis (HCC) [F29] 09/22/2017  . Endometrioma (left ovary) [N80.9] 08/22/2017  . Endometriosis determined by laparoscopy [N80.9] 08/22/2017  . Altered mental status [R41.82] 08/20/2017  . Left ovarian cyst [N83.202] 06/02/2017  . Pelvic pain [R10.2] 06/02/2017  . Nausea [R11.0] 06/02/2017  . Bacterial vaginitis [N76.0, B96.89] 06/02/2017   Past Psychiatric History: See H&P  Past Medical History:  Past Medical History:  Diagnosis Date  . Ovarian cyst     Past Surgical History:  Procedure Laterality Date  . LAPAROSCOPY Left 08/20/2017   Procedure: LAPAROSCOPY DIAGNOSTIC WITH DRAINAGE OF LEFT OVARIAN CYST;  Surgeon: Tremont BingPickens, Charlie, MD;  Location: [facility];  Service: Gynecology;  Laterality: Left;  . WISDOM TOOTH EXTRACTION     local   Family History: No family history on file.  Family Psychiatric  History: See H&P  Social History:  Social History   Substance and Sexual Activity  Alcohol Use No  . Frequency: Never     Social History   Substance and Sexual Activity  Drug Use No    Social History   Socioeconomic History  . Marital status: Single    Spouse name: Not on file  . Number of children: Not on file  . Years of education: Not on file  . Highest education  level: Not on file  Occupational History  . Not on file  Social Needs  . Financial resource strain: Not on file  . Food insecurity:    Worry: Not on file    Inability: Not on file  . Transportation needs:    Medical: Not on file    Non-medical: Not on file  Tobacco Use  . Smoking status: Never Smoker  . Smokeless tobacco: Never Used  Substance and Sexual Activity  . Alcohol use: No    Frequency: Never  . Drug use: No  . Sexual activity: Yes    Birth control/protection: Condom  Lifestyle  . Physical activity:    Days per week: Not on file    Minutes per session: Not on file  . Stress: Not on file  Relationships  . Social connections:    Talks on phone: Not on file    Gets together: Not on file    Attends religious service: Not on file    Active member of club or organization: Not on file    Attends meetings of clubs or organizations: Not on file    Relationship status: Not on file  Other Topics Concern  . Not on file  Social History Narrative  . Not on file   Hospital Course: Rene PaciKenyatta Mcmurtry is a 27 y/o F with no previous psychiatric history who [facility] after she walked in with report of worsening anxiety, depression, SI with plan, and decreased need for  sleepwith no sleep for 5 days prior to admission.Pt was started on trial of abilify to address symptoms of bipolar mania.Pt has been reporting incremental improvement of her presenting symptoms including improved sleep. She noted some daytime sedation with abilify, so her dose was reduced to 5mg  qDay, which she has been tolerating well.  Besides the use of Abilify 5 mg for bipolar mania, Spero Geralds was also medicated & discharged on; Hydroxyzine 25 mg prn for anxiety & Trazodone 50 mg for depression. She was resumed on all her pertinent home medications for her pre-existing medical issues presented. She tolerated her treatment regimen without any adverse effects or reactions reported. Spero Geralds was  enrolled & participated in the group counseling sessions being offered & held on this unit. She learned coping skills.  Today upon her discharge evaluation with the attending psychiatrist, pt shares, "I'm good today." She denies any specific concerns. She is sleeping well. Her appetite is good. She denies any physical complaints. She denies SI/HI/AH/VH. She is tolerating her medication well and she feels that it has been helpful for her presenting symptoms. She is in agreement to continue her current treatment regimen without changes. She is in agreement to have outpatient follow up at [facility]. She was able to engage in safety planning including plan to return to [facility] or contact emergency services if she feels unable to maintain her own safety or the safety of others. Pt had no further questions, comments, or concerns.  Upon discharge, Spero Geralds presented mentally & medically stable. She will continue mental treatment on an outpatient basis as noted below. She was provided with all the necessary information needed to make this appointment without problems. She left [facility] with all personal belongings in no apparent distress. Transportation per self.  Physical Findings: AIMS: Facial and Oral Movements Muscles of Facial Expression: None, normal Lips and Perioral Area: None, normal Jaw: None, normal Tongue: None, normal,Extremity Movements Upper (arms, wrists, hands, fingers): None, normal Lower (legs, knees, ankles, toes): None, normal, Trunk Movements Neck, shoulders, hips: None, normal, Overall Severity Severity of abnormal movements (highest score from questions above): None, normal Incapacitation due to abnormal movements: None, normal Patient's awareness of abnormal movements (rate only patient's report): No Awareness, Dental Status Current problems with teeth and/or dentures?: No Does patient usually wear dentures?: No  CIWA:    COWS:     Musculoskeletal: Strength &  Muscle Tone: within normal limits Gait & Station: normal Patient leans: N/A  Psychiatric Specialty Exam: Physical Exam  Constitutional: She appears well-developed.  HENT:  Head: Normocephalic.  Eyes: Pupils are equal, round, and reactive to light.  Neck: Normal range of motion.  Cardiovascular: Normal rate.  Respiratory: Effort normal.  GI: Soft.  Genitourinary:  Genitourinary Comments: Deferred  Musculoskeletal: Normal range of motion.  Neurological: She is alert.  Skin: Skin is warm.    Review of Systems  Constitutional: Negative.   HENT: Negative.   Eyes: Negative.   Cardiovascular: Negative.   Gastrointestinal: Negative.   Genitourinary: Negative.   Musculoskeletal: Negative.   Skin: Negative.   Neurological: Negative.   Endo/Heme/Allergies: Negative.   Psychiatric/Behavioral: Positive for depression (Stable) and hallucinations (Hx. paranoia). Negative for memory loss, substance abuse and suicidal ideas. The patient has insomnia (Stable). The patient is not nervous/anxious (Stable).     Blood pressure 100/68, pulse (!) 106, temperature 98.9 F (37.2 C), temperature source Oral, resp. rate 16, height 5\' 4"  (1.626 m), weight 53.1 kg (117 lb), last menstrual period 09/09/2017, SpO2 98 %.  Body mass index is 20.08 kg/m.  See Md's SRA   Have you used any form of tobacco in the last 30 days? (Cigarettes, Smokeless Tobacco, Cigars, and/or Pipes): No  Has this patient used any form of tobacco in the last 30 days? (Cigarettes, Smokeless Tobacco, Cigars, and/or Pipes): N/A  Blood Alcohol level:  Lab Results  Component Value Date   ETH <10 09/23/2017   Metabolic Disorder Labs:  Lab Results  Component Value Date   HGBA1C 4.4 (L) 09/24/2017   MPG 79.58 09/24/2017   No results found for: PROLACTIN No results found for: CHOL, TRIG, HDL, CHOLHDL, VLDL, LDLCALC  See Psychiatric Specialty Exam and Suicide Risk Assessment completed by Attending Physician prior to  discharge.  Discharge destination:  Home  Is patient on multiple antipsychotic therapies at discharge:  No   Has Patient had three or more failed trials of antipsychotic monotherapy by history:  No  Recommended Plan for Multiple Antipsychotic Therapies: NA  Allergies as of 09/26/2017      Reactions   Scopolamine    Halluicinations      Medication List    TAKE these medications     Indication  ARIPiprazole 5 MG tablet Commonly known as:  ABILIFY Take 1 tablet (5 mg total) by mouth daily. For mood control Start taking on:  09/27/2017  Indication:  Mood control   hydrOXYzine 25 MG tablet Commonly known as:  ATARAX/VISTARIL Take 1 tablet (25 mg total) by mouth every 6 (six) hours as needed for anxiety.  Indication:  Feeling Anxious   norethindrone 5 MG tablet Commonly known as:  AYGESTIN Take 1 tablet (5 mg total) by mouth daily. For abnormal bleeding. What changed:  additional instructions  Indication:  Abnormal Bleeding From the Uterus due to Hormone Imbalance   traZODone 50 MG tablet Commonly known as:  DESYREL Take 1 tablet (50 mg total) by mouth at bedtime as needed for sleep.  Indication:  Trouble Sleeping      Follow-up Information    [facility] up on 10/13/2017.   Why:  Monday at 9AM with Dr A for your hospital follow up appointment Contact information: [address] [place] [address] [phone]          Follow-up recommendations: Activity:  As tolerated Diet: As recommended by your primary care doctor. Keep all scheduled follow-up appointments as recommended.  Comments: Patient is instructed prior to discharge to: Take all medications as prescribed by his/her mental healthcare provider. Report any adverse effects and or reactions from the medicines to his/her outpatient provider promptly. Patient has been instructed & cautioned: To not engage in alcohol and or illegal drug use while on prescription medicines. In  the event of worsening symptoms, patient is instructed to call the crisis hotline, 911 and or go to the nearest ED for appropriate evaluation and treatment of symptoms. To follow-up with his/her primary care provider for your other medical issues, concerns and or health care needs.   Signed: Armandina Stammer, NP, PMHNP, FNP-BC 09/26/2017, 10:05 AM   Patient seen, Suicide Assessment Completed.  Disposition Plan Reviewed

## 2017-09-26 NOTE — [facility] Suicide Risk Assessment (Signed)
[facility] Discharge Suicide Risk Assessment   Principal Problem: Bipolar I disorder, most recent episode mixed [facility]) Discharge Diagnoses:  Patient Active Problem List   Diagnosis Date Noted  . Bipolar I disorder, most recent episode mixed (HCC) [F31.60] 09/23/2017  . Psychosis (HCC) [F29] 09/22/2017  . Endometrioma (left ovary) [N80.9] 08/22/2017  . Endometriosis determined by laparoscopy [N80.9] 08/22/2017  . Altered mental status [R41.82] 08/20/2017  . Left ovarian cyst [N83.202] 06/02/2017  . Pelvic pain [R10.2] 06/02/2017  . Nausea [R11.0] 06/02/2017  . Bacterial vaginitis [N76.0, B96.89] 06/02/2017    Total Time spent with patient: 30 minutes  Musculoskeletal: Strength & Muscle Tone: within normal limits Gait & Station: normal Patient leans: N/A  Psychiatric Specialty Exam: Review of Systems  Constitutional: Negative for chills and fever.  Respiratory: Negative for cough and shortness of breath.   Cardiovascular: Negative for chest pain.  Gastrointestinal: Negative for abdominal pain, heartburn, nausea and vomiting.  Psychiatric/Behavioral: Negative for depression, hallucinations and suicidal ideas. The patient is not nervous/anxious and does not have insomnia.     Blood pressure 100/68, pulse (!) 106, temperature 98.9 F (37.2 C), temperature source Oral, resp. rate 16, height 5\' 4"  (1.626 m), weight 53.1 kg (117 lb), last menstrual period 09/09/2017, SpO2 98 %.Body mass index is 20.08 kg/m.  General Appearance: Casual and Fairly Groomed  Patent attorney::  Good  Speech:  Clear and Coherent and Normal Rate  Volume:  Normal  Mood:  Euthymic  Affect:  Appropriate, Congruent and Constricted  Thought Process:  Coherent and Goal Directed  Orientation:  Full (Time, Place, and Person)  Thought Content:  Logical  Suicidal Thoughts:  No  Homicidal Thoughts:  No  Memory:  Immediate;   Fair Recent;   Fair Remote;   Fair  Judgement:  Good  Insight:  Fair  Psychomotor Activity:   Normal  Concentration:  Good  Recall:  Good  Fund of Knowledge:Good  Language: Good  Akathisia:  No  Handed:    AIMS (if indicated):     Assets:  Communication Skills Desire for Improvement Financial Resources/Insurance Housing Physical Health Resilience Social Support  Sleep:  Number of Hours: 6.75  Cognition: WNL  ADL's:  Intact   Mental Status Per Nursing Assessment::   On Admission:  Self-harm thoughts  Demographic Factors:  Living alone  Loss Factors: Decrease in vocational status and Decline in physical health  Historical Factors: NA  Risk Reduction Factors:   Positive social support, Positive therapeutic relationship and Positive coping skills or problem solving skills  Continued Clinical Symptoms:  Bipolar Disorder:   Mixed State  Cognitive Features That Contribute To Risk:  None    Suicide Risk:  Minimal: No identifiable suicidal ideation.  Patients presenting with no risk factors but with morbid ruminations; may be classified as minimal risk based on the severity of the depressive symptoms  Follow-up Information    [facility] Follow up on 10/13/2017.   Why:  Monday at 9AM with Dr A for your hospital follow up appointment Contact information: [address] [place] [address] [phone]         Subjective Data: Paige Mccarty is a 27 y/o F with no previous psychiatric history who was admitted voluntarily to [facility] after she walked in with report of worsening anxiety, depression, SI with plan, and decreased need for sleepwith no sleep for 5 days prior to admission.Pt was started on trial of abilify to address symptoms of bipolar mania. Pt has been  reporting incremental improvement of her presenting symptoms including improved sleep. She noted some daytime sedation with abilify, so her dose was reduced to 5mg  qDay, which she has been tolerating well.  Today upon evaluation, pt shares, "I'm good today." She denies any  specific concerns. She is sleeping well. Her appetite is good. She denies any physical complaints. She denies SI/HI/AH/VH. She is tolerating her medication well and she feels that it has been helpful for her presenting symptoms. She is in agreement to continue her current treatment regimen without changes. She is in agreement to have outpatient follow up at [facility]. She was able to engage in safety planning including plan [facility] or contact emergency services if she feels unable to maintain her own safety or the safety of others. Pt had no further questions, comments, or concerns.   Plan Of Care/Follow-up recommendations:   -Discharge to outpatient level of care  -Bipolar I, current episode mixed, without psychotic features -Continue abilify 5mg  po qDay  -Anxiety -Continueatarax 25mg  po q6h prn anxiety  -Insomnia -Continuetrazodone 50mg  po qhs prn insomnia  -Endometriosis - Continue norethindrone 5mg  po qhs  Activity:  as tolerated Diet:  normal Tests:  NA Other:  see above for DC plan  Paige Likenshristopher T Baron Parmelee, MD 09/26/2017, 10:11 AM

## 2017-09-26 NOTE — Plan of Care (Signed)
4.12.19. Pt. Engaged in groups with a calm and appropriate mood at least 2x within 5 Recreation Therapy groups sessions  

## 2017-09-26 NOTE — Progress Notes (Signed)
Patient ID: Paige Mccarty, female   DOB: Nov 07, 1990, 27 y.o.   MRN: [id number] Patient discharged to home/self care.  Patient was able to transport self home.  Upon discharge patient denies SI, HI and AVH. Patient acknowledged understanding of all discharge instructions and receipt of all personal belongings.  Patient affect was bright and patient stated that she was ready to get back to her normal life.

## 2017-09-26 NOTE — Progress Notes (Addendum)
Recreation Therapy Notes  Date: 4.12.19 Time: 10 a.m. Location: [address] Dayroom   Group Topic: Stress Management, Forgiveness    Goal Area(s) Addresses:  Goal 1.1: To reduce stress  -Patient will identify how forgiveness can be beneficial  -Patient will identify the importance of stress management  -Patient will participate during Recreation Therapy group tx.    Behavioral Response: Engaged   Intervention: Stress Management   Activity: Meditation- Patients were in a peaceful environment with soft lighting enhancing patients mood. Patients listened to a forgiveness meditation on the calm app to help decrease stress levels   Education: Stress Management, Discharge Planning.    Education Outcome: Acknowledges edcuation/In group clarification offered/Needs additional education   Clinical Observations/Feedback:: Patient attended and participated appropriately during Recreation Therapy group treatment successfully identifying how forgiveness can be beneficial. Patient was able to identify the importance of stress management. Patient participated during opening and closing discussion. Patient successfully met Goal 1.1 (See Above)    Ranell Patrick, Recreation Therapy Intern   Ranell Patrick 09/26/2017 11:15 AM

## 2018-07-07 IMAGING — US US PELVIS COMPLETE
1 series · 15 of 25 positions shown · non-contrast
Comparison: None.

CLINICAL DATA: Chronic pelvic pain with lump in the left lower
quadrant

EXAM:
ULTRASOUND PELVIS TRANSVAGINAL
TECHNIQUE: Transvaginal ultrasound examination of the pelvis was performed
including evaluation of the uterus, ovaries, adnexal regions, and
pelvic cul-de-sac.

[Series 1: us pelvis complete · 37 acquisitions, 15 frames shown]
[im 1/37]
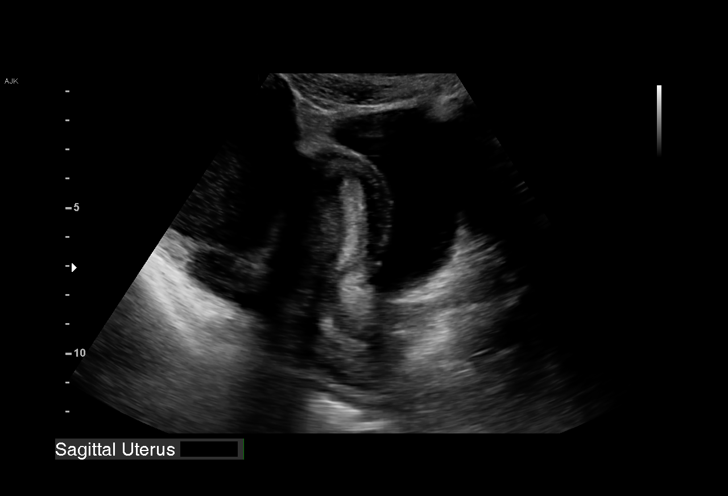
[im 4/37]
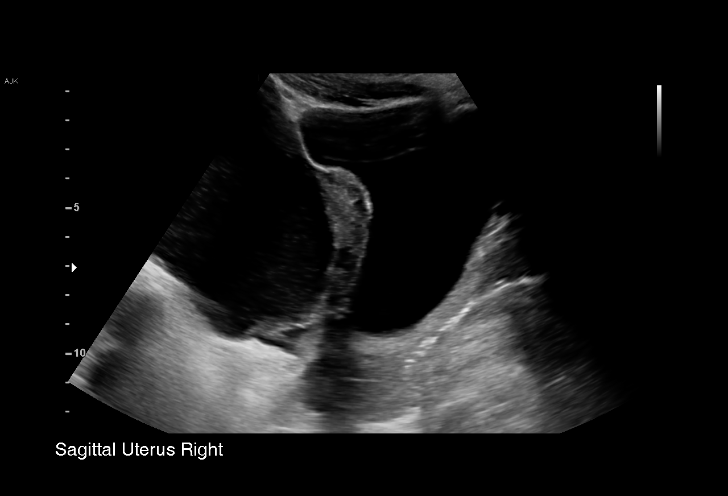
[im 7/37]
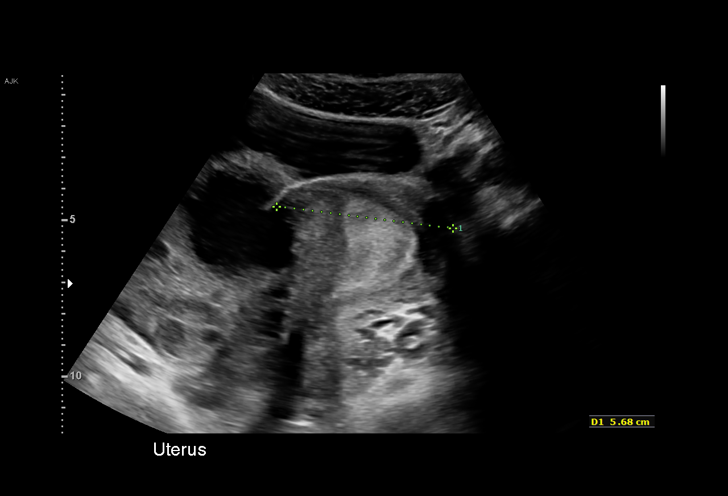
[im 8/37]
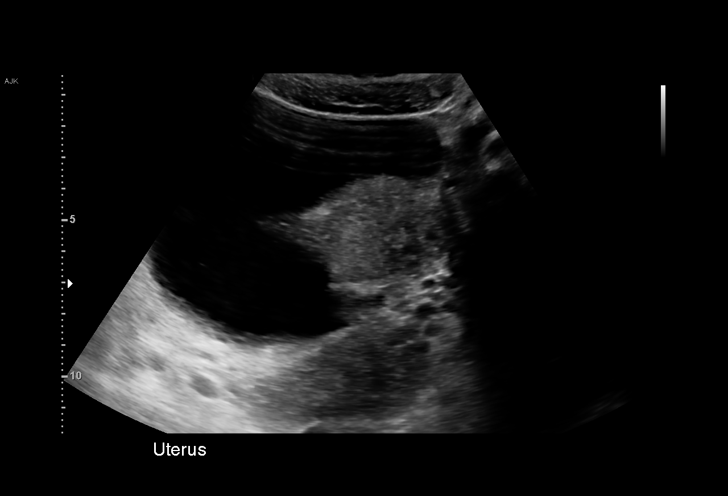
[im 11/37]
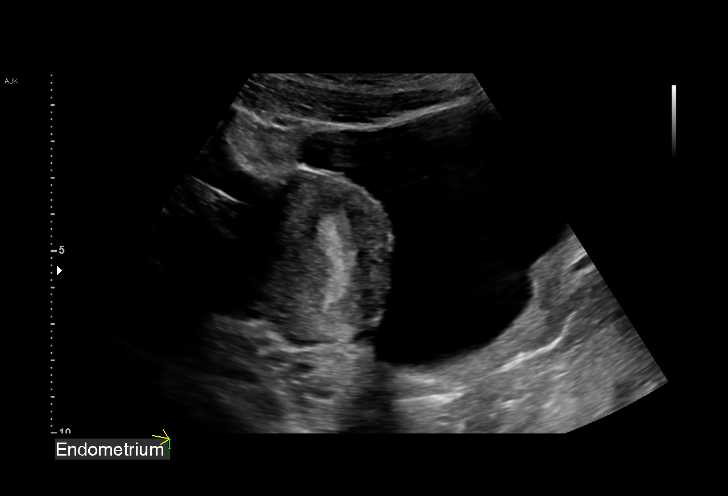
[im 14/37]
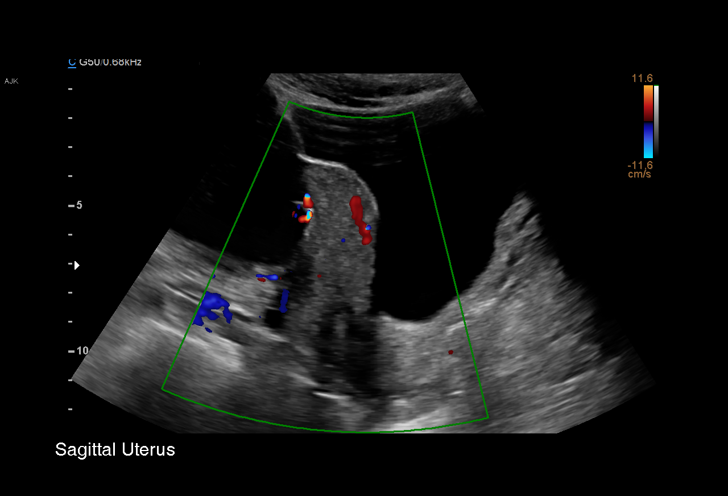
[im 16/37]
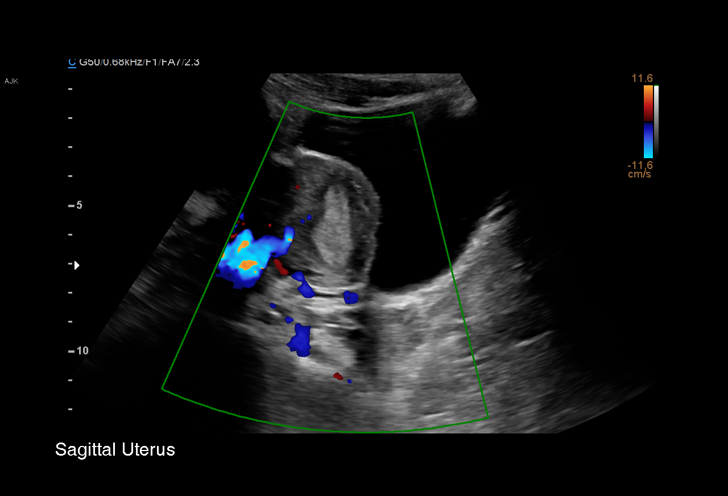
[im 19/37]
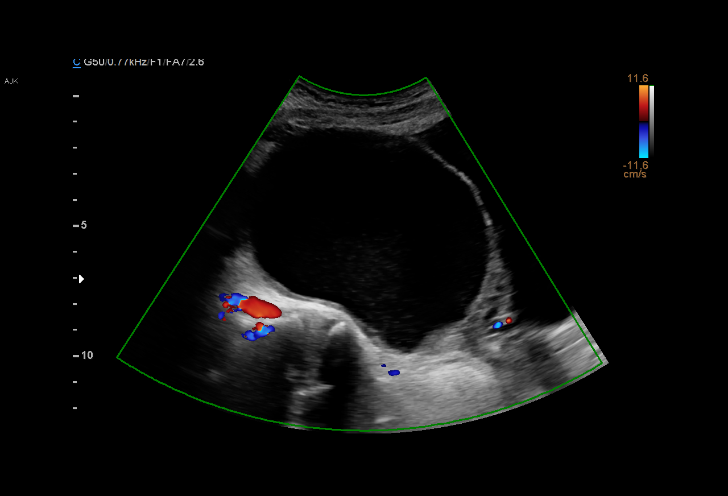
[im 22/37]
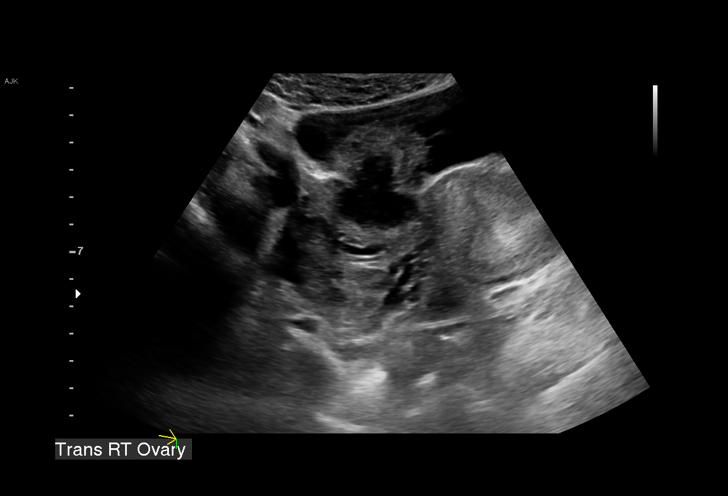
[im 23/37]
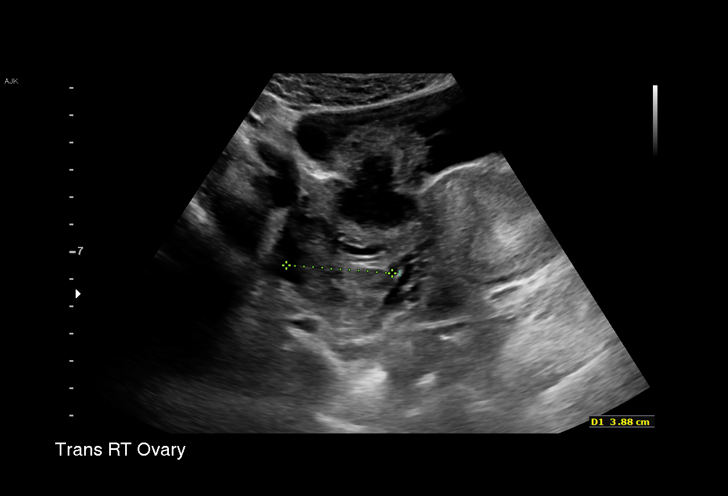
[im 26/37]
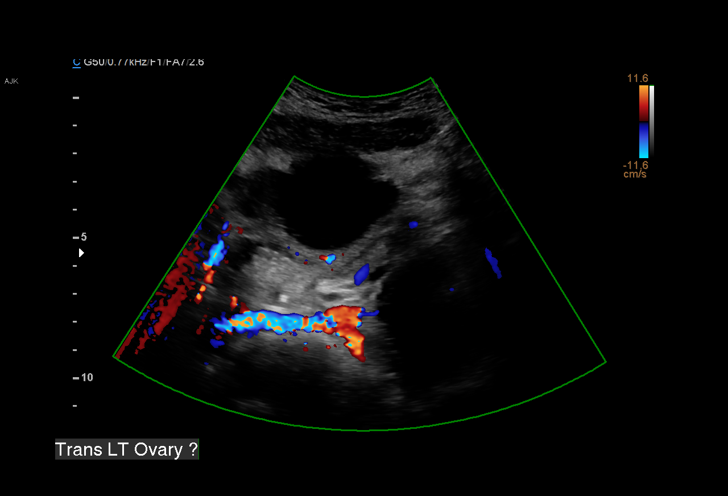
[im 29/37]
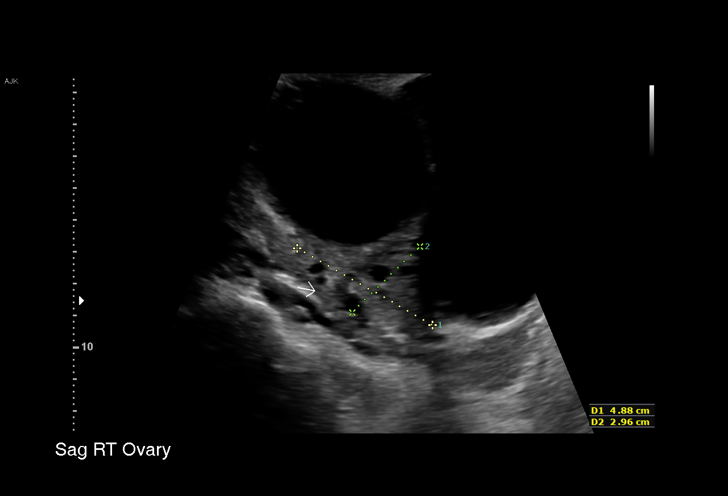
[im 31/37]
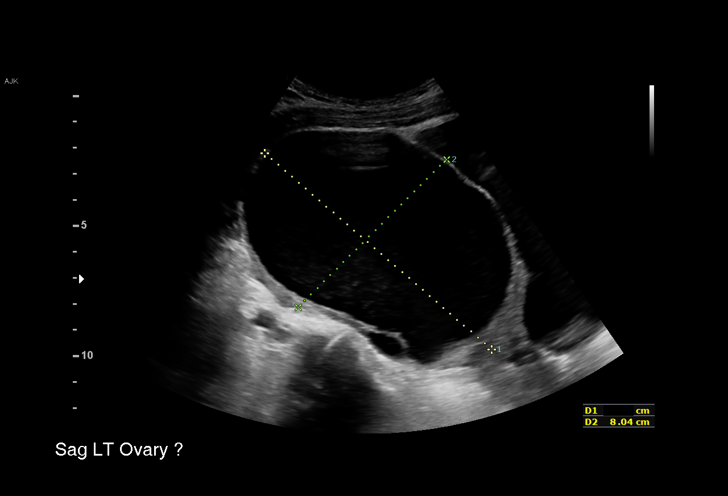
[im 34/37]
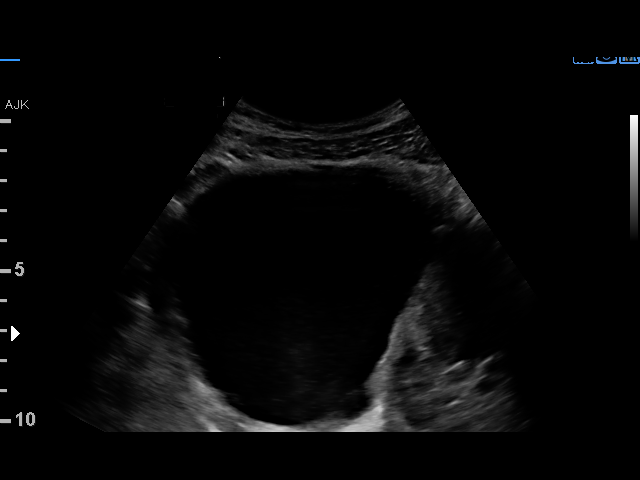
[im 37/37]
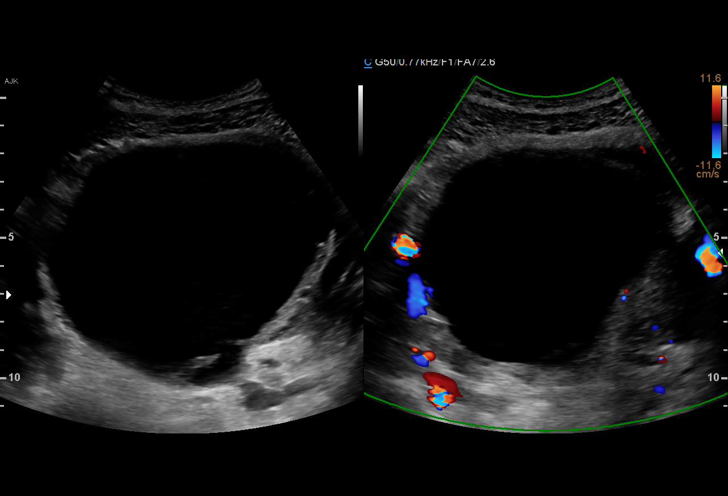

[15 of 25 positions shown; findings below may reference images not displayed]

FINDINGS: Uterus

Measurements: 9.2 x 3.5 x 5.7 cm. No fibroids or other mass
visualized.

Endometrium

Thickness: 9-10 mm.  No focal abnormality visualized.

Right ovary

Measurements: 4.9 x 3.0 x 3.9 cm. Normal appearance/no adnexal mass.

Left ovary

Large cystic mass identified left adnexal space measuring 10.3 x
x 10.4 cm. The lesion has low level internal echoes without evidence
for septation or mural nodularity. This is likely ovarian and
origin.

Other findings:  No abnormal free fluid
IMPRESSION: 10 cm mildly complex cystic lesion left adnexal space, likely
ovarian origin. Internal low level echoes are more pronounced than
typically seen for a simple cyst and there is some minimal wall
irregularity. Internal echoes are less prominent than typical for
endometrioma. No internal reticulation as typically seen for
hemorrhagic cyst. Given this appearance, the lesion is considered
indeterminate but probably benign. Follow-up ultrasound in 6 weeks
recommended to ensure resolution. This recommendation follows the
consensus statement: Management of Asymptomatic Ovarian and Other
Adnexal Cysts Imaged at US: Society of Radiologists in Ultrasound

## 2018-08-18 IMAGING — US US PELVIS COMPLETE
1 series · 15 of 25 positions shown · non-contrast
Comparison: 06/02/2017

CLINICAL DATA: Left ovarian cyst

EXAM:
TRANSABDOMINAL ULTRASOUND OF PELVIS
TECHNIQUE: Transabdominal ultrasound examination of the pelvis was performed
including evaluation of the uterus, ovaries, adnexal regions, and
pelvic cul-de-sac.

[Series 1: us pelvis complete · 15 of 48 slices shown]
[im 1/48]
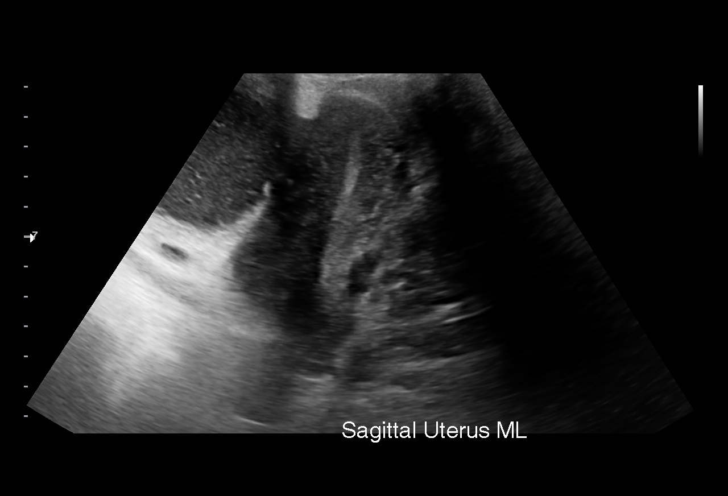
[im 4/48]
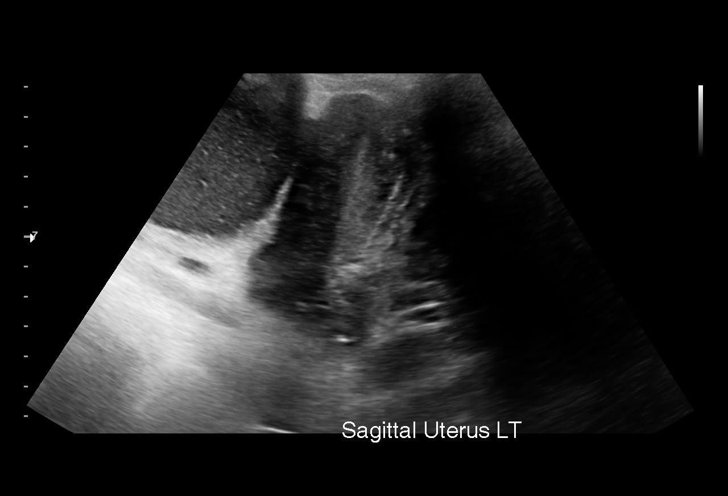
[im 8/48]
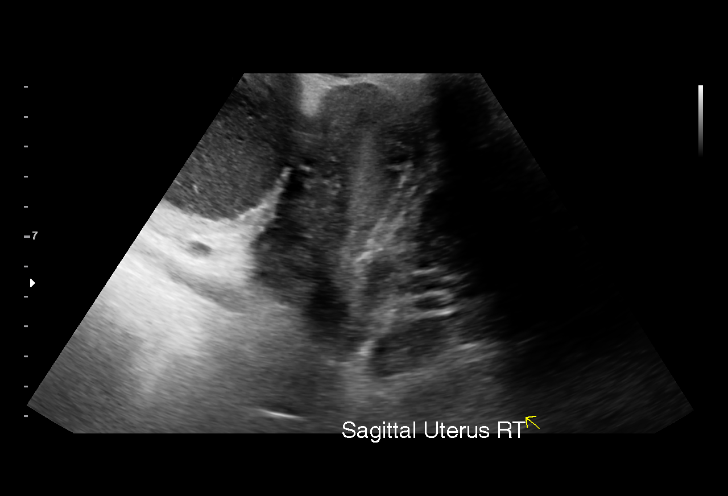
[im 10/48]
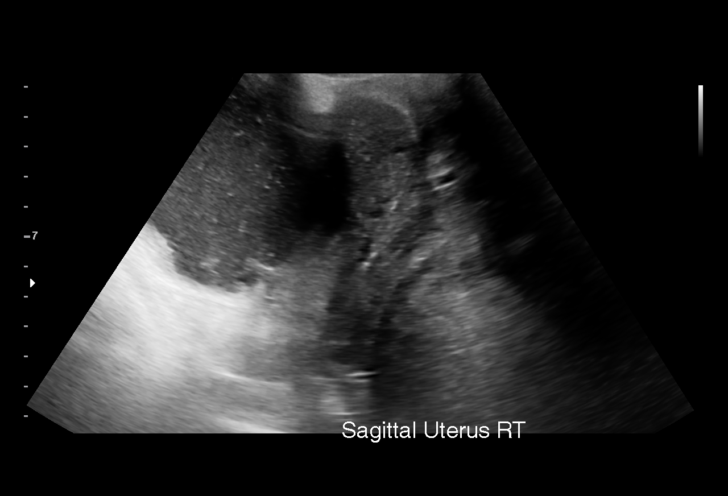
[im 14/48]
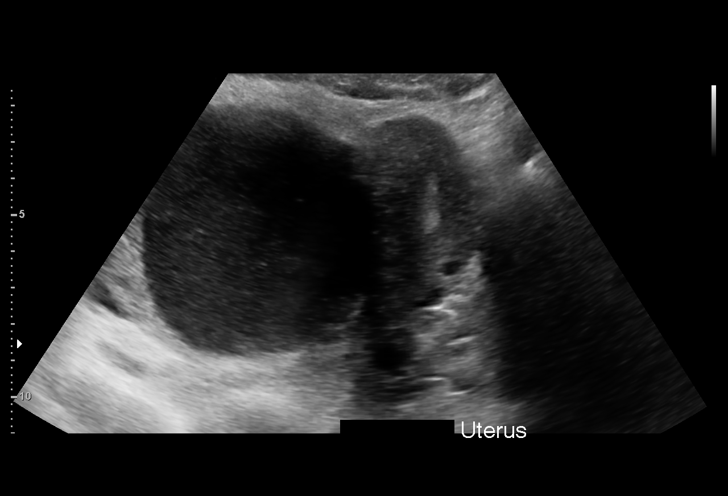
[im 18/48]
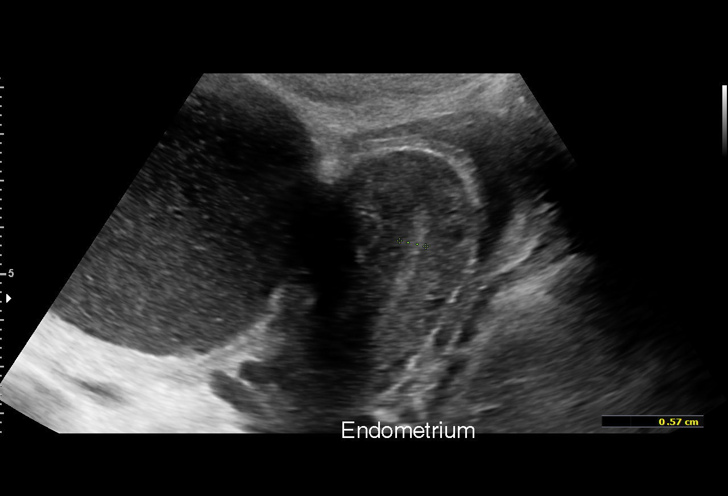
[im 20/48]
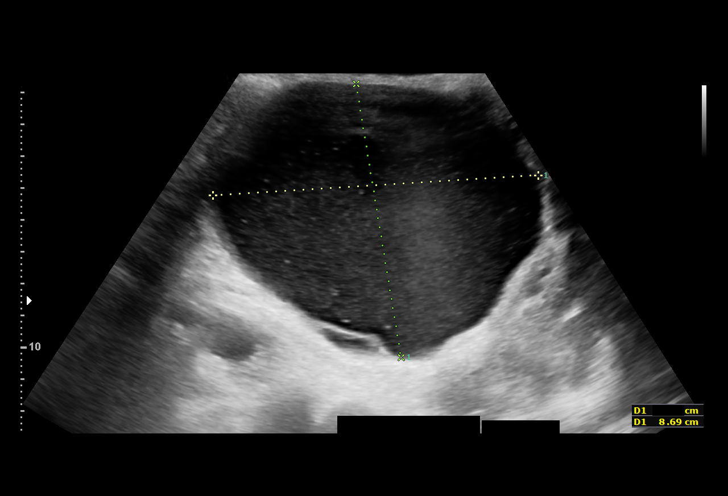
[im 24/48]
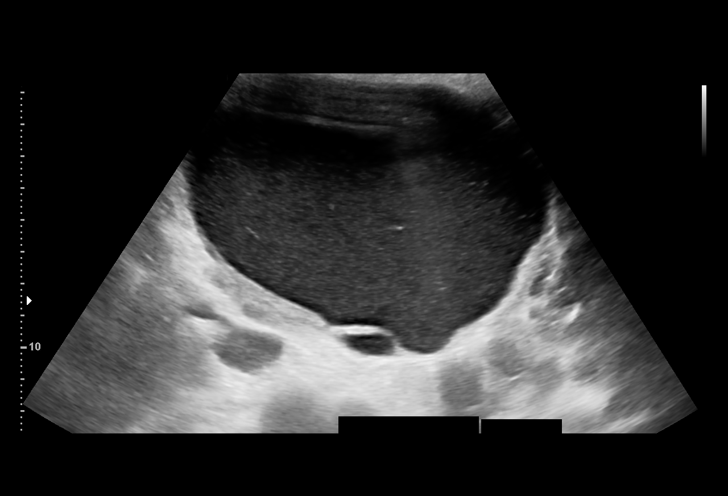
[im 28/48]
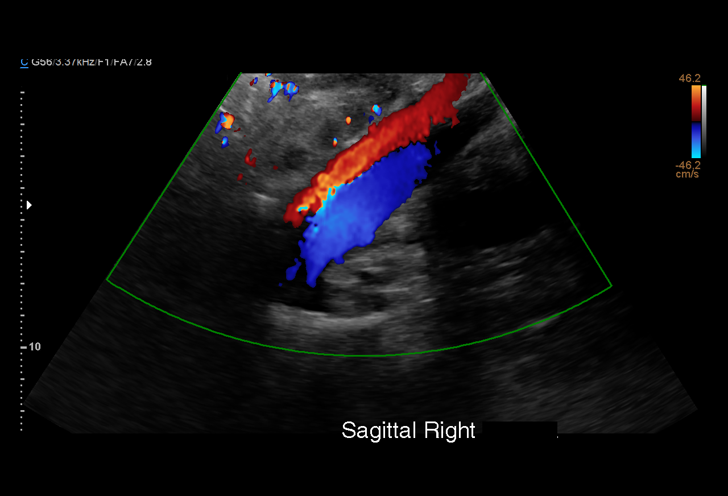
[im 30/48]
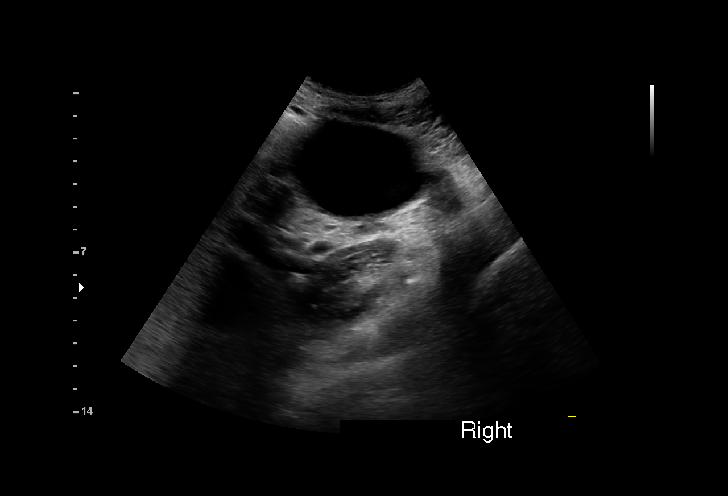
[im 34/48]
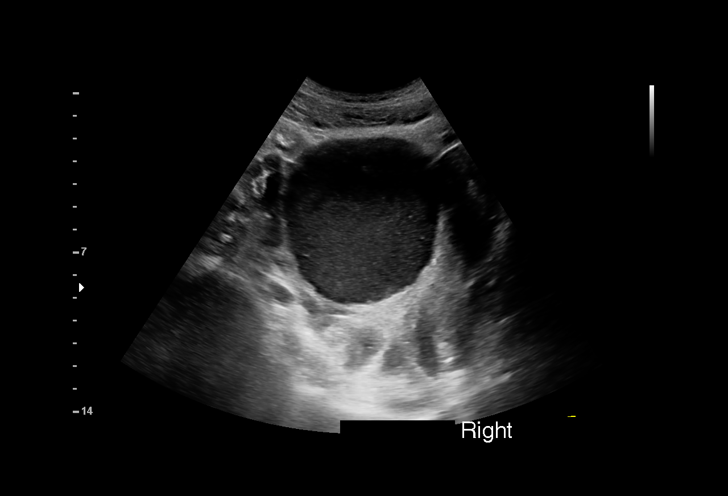
[im 38/48]
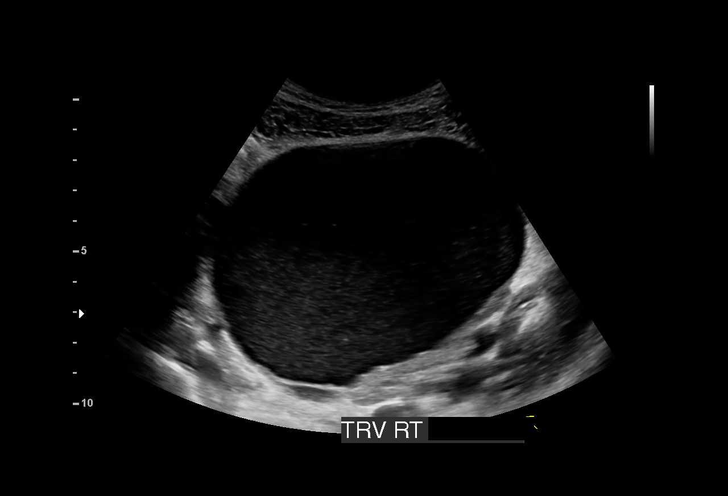
[im 40/48]
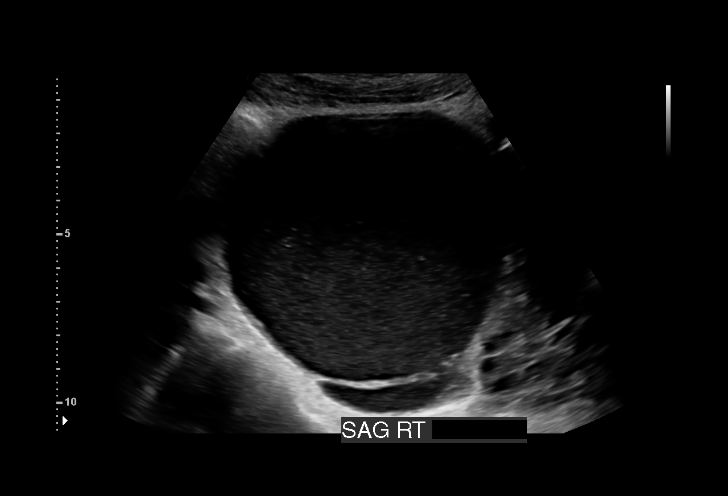
[im 44/48]
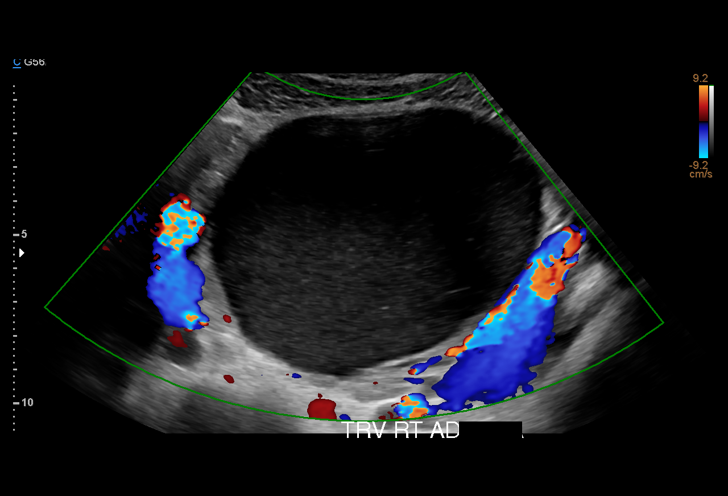
[im 48/48]
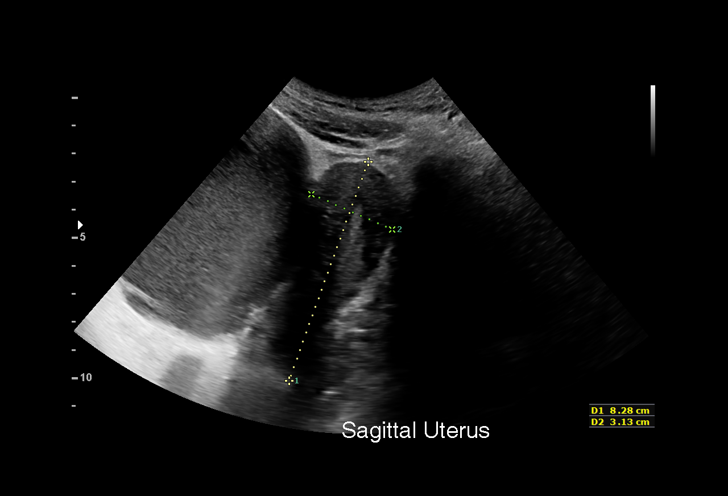

[15 of 25 positions shown; findings below may reference images not displayed]

FINDINGS: Uterus

Measurements: 8.3 x 3.1 x 5.4 cm. No fibroids or other mass
visualized.

Endometrium

Thickness: 6 mm.  No focal abnormality visualized.

Right ovary

Not discretely visualized.  No adnexal mass is seen.

Left ovary

Measurements: 9.9 x 8.9 x 10.6 cm. 8.3 x 8.7 x 10.1 cm
complex/hemorrhagic cystic lesion, grossly unchanged, previously
10.3 x 7.5 x 10.4 cm. No solid component/mural nodularity.

Other findings:  No abnormal free fluid.
IMPRESSION: 10.1 cm complex/hemorrhagic left ovarian cystic lesion, favoring an
endometrioma. Physiologic hemorrhagic cyst is considered unlikely
given lack of interval change. Given size, OB-GYN consultation is
suggested for possible surgical consideration.

## 2022-03-12 LAB — HEMOGLOBIN A1C
Estimated Avg Glucose, External: 106 mg/dL (ref 91–123)
Hemoglobin A1C, External: 5.3 % (ref 4.8–5.6)

## 2023-11-01 LAB — HEMOGLOBIN A1C: Hemoglobin A1C, External: 5.2 % (ref 4.8–5.6)

## 2023-12-10 NOTE — Patient Instructions (Incomplete)
 Please make a follow up appointment to discuss the results of your sleep study or I will send you a "my chart" message with your results and treatment options.     The [employer] [facility] is located in the [facility], adjacent to [facility]. The lab is on the second floor. The direct number to call for sleep study related questions is: [phone].    Please call our clinic back at [phone] or send a message on MyChart if you have any questions or concerns or if you are experiencing any of the following:     You have not received a follow up appointment within 30 days prior the recommended follow up time.    If you are not tolerating treatment plan and/or not able to obtain equipment or prescribed medication(s).  if you are experiencing any difficulties with the Durable Medical Equipment  (DME) Company you may be using or is assigned to you.  Two weeks have passed and you have not received an appointment for a scheduled procedure.  Two weeks have passed since you underwent a test and/or procedure and you have not received your results.  Your  Durable Medical Equipment (DME ) company is supposed to provide you with replacement filters, tubing and masks. You can either call your DME when you need new supplies or you can arrange for an automatic shipment schedule.    Your need to be seen by our office at lat minimum of every 12 months in order to renew the prescription for these supplies.   Please make note of who your DME company is and their phone number.   Please make sure that you clean your mask and hosing on a regular basis.  Your DME can provide you with additional information regarding proper care and cleaning of your device    Driver Safety is strongly encouraged.  You should not drive if sleepy, tired, distracted and/or fatigued.      Chest Xrays and blood work do not require appointments.  They are considered "Walk-In" services and can be obtained at either [facility] or [facility].  Blood work is performed at the Laboratory (Lab) from 08:00am - 04:00 pm (if applicable to your visit)

## 2023-12-11 ENCOUNTER — Ambulatory Visit: Admit: 2023-12-11 | Discharge: 2023-12-11 | Payer: 59 | Attending: Otolaryngology | Primary: Internal Medicine

## 2023-12-11 DIAGNOSIS — R29818 Other symptoms and signs involving the nervous system: Secondary | ICD-10-CM

## 2023-12-11 NOTE — Progress Notes (Signed)
 [facility]   Sleep Medicine     Office Progress Note - Initial Evaluation        [address]  [address]  [place] [address]  [phone]  [phone] Fax    Reason for visit/referral: Evaluation for possible obstructive sleep apnea/sleep disordered breathing  Assessment:      1. Suspected sleep apnea  -     Polysomnography (95810); Future  2. Snoring  3. Excessive daytime sleepiness  4. Waking at night short of breath  Comments:  Possibly, unclear  5. Endometriosis  Assessment & Plan:   Monitored by specialist- no acute findings meriting change in the plan  6. Depressive disorder  Assessment & Plan:   Unclear history of this, currently not taking medications.  Obviously could be a significant factor in sleep disruption  7. Anemia, unspecified type     Madeline Murray is a 33 year old female with a history of endometriosis, vitamin D deficiency, anxiety and depression.     It is unclear how long she has had trouble sleeping but part of this may be delayed sleep phase disorder.  She did say that if she goes to bed between 1 and 2 AM she has much less trouble falling asleep.    Recently, her sister and mother came to visit Madeline Murray she was told she snores.  She also may have pauses in her breathing or shallow breathing but it was difficult to determine.    She has bother during the day by her severe endometriosis and is seen for that and has had multiple surgeries.  It is unclear if this disrupts her sleep at this point.    We discussed getting a PSG to determine if she has another primary sleep disorder that is interfering with her sleep.    I have discussed the nature and definition of obstructive sleep apnea and why it would be important to evaluate for this.  We did discuss how undiagnosed/untreated obstructive sleep apnea can affect other medical conditions/disorders, and in particular, cardiovascular disorders.  The consequences of untreated obstructive sleep apnea include  the increased risk of stroke, heart disease, hypertension, cognitive difficulties (attention, memory), possible endocrine difficulties to include insulin resistance and possible increased risk of diabetes, increased sleepiness and fatigue and increased risk of motor vehicle accidents.    We discussed the different kinds of sleep studies and I believe a PSG is a reasonable option in the setting    We did discuss not driving sleepy and how weight gain/weight loss can affect obstructive sleep apnea.    Patient asked appropriate questions and would like to move forward with obtaining desired study.      Plan:   I will order a PSG and have her follow-up in the office to go over the results.    This information was analyzed to assess complexity and medical decision making in regards to further testing and management.      Orders Placed This Encounter   Procedures    Polysomnography (04189)     Standing Status:   Future     Expected Date:   12/12/2023     Expiration Date:   12/10/2024     Scheduling Instructions:      34 year old female with a history of snoring, disrupted sleep, daytime fatigue. Ordering PSG to evaluate for possible Obstructive Sleep Apnea.        Potential consequences of untreated sleep apnea, and/or excessive daytime sleepiness were discussed  with the patient.  Educational materials provided.  Treatment options including CPAP, dental appliance, weight reduction measures, positional therapy, surgeries etc were at least briefly discussed.  Healthy lifestyle changes to include weight loss and exercise discussed.  Healthy sleep habits were reviewed and encouraged.  Driver and workplace safety reviewed and discussed as appropriate. Drowsy and/or inattentive driving should be avoided.  Follow up with Primary Care Provider (PCP) as directed and for routine health care maintenance.  Follow-up: 1-2 months (after sleep study complete), sooner should new symptoms or problems arise.  Thank you for allowing me to  participate in the care of your patient!  Subjective:     Patient ID: Madeline Murray is a 33 y.o. female.    Chief Complaint   Patient presents with    Snoring    Sleep Problem     Waking up multiple times during the night.      HPI:      Madeline Murray is a 33 y.o. female referred by Gershon Prime, MD for a sleep evaluation. She complains of:     The patient has a long winding down.  At night which involved watching TV, writing out plans for the next day, possibly taking a shower excetra.  She gets into bed between 10 and 11 PM and it will take her a few hours to fall asleep.  She denies using her phone or watching TV during that.    She admitted that if she went to bed between 1 or 2 AM, she would fall asleep quickly.  She wakes up 3-4 times throughout the night and may get up 0-1 time to use the restroom.  She has difficulty getting back to sleep and sometimes does not go back to sleep at all.  Her alarm is set for 630 to 7 AM but she is usually awake before this.    She has been through sleep hygiene or CBT-I already and was told to get out of bed if she cannot fall asleep within 20 or 30 minutes when she initially gets into bed.    Lately she has been told she snores and there may be some breathing issue at night.  It is unclear if she wakes up choking or gasping for air or has pauses in her breathing.    She is fatigued throughout the day which is her main complaint.    She does nap during the day, will sleep for 20 minutes and then wake up and then feel sleepy again and go back to sleep.  It is unclear actually how much sleep she gets during the day or at night but she thinks she is only getting 2 to 3 hours of sleep at night.    She has a history of severe endometriosis which bothers her during the day significantly.    Previous sleep evaluation and treatment has included- None    DOT/CDL - No  FAA/Pilot's license -No    Previous Report(s) Reviewed: historical medical records, office notes, and  referral letter(s). Pertinent data has been documented.    Sleep symptoms:  Yes No  Additional Comments   Snoring [x]  []     Witness Apneas [x]  []  Possibly   Waking snorting/gasping []  []  Unclear   Nocturia []     [x]     Legs kicking at night []  [x]     AM Headaches []  [x]     Non-restorative sleep [x]  []     Daytime sleepiness/fatigue [x]  []     Daytime  napping [x]  []     Drowsiness while driving []  []     Memory issues []  []  ?   Decreased concentration []  [x]     Cataplexy []  [x]     Hypnogogic hallucinations []  [x]     Sleep paralysis []  [x]     Bruxism []  [x]     Clock Watching [x]  []     Other []  []             12/11/2023    10:08 AM   Sleep Questionnaire   Sitting and reading 0   Watching TV 1   Sitting, inactive in a public place (e.g. a theatre or a meeting) 0   As a passenger in a car for an hour without a break 0   Lying down to rest in the afternoon when circumstances permit 3   Sitting and talking to someone 0   Sitting quietly after a lunch without alcohol 3   In a car, while stopped for a few minutes in traffic 0   Epworth Sleepiness Score 7       Occupation/work hours: Unemployed    Caffeine Intake - None.    Social History     Socioeconomic History    Marital status: Single     Spouse name: Not on file    Number of children: Not on file    Years of education: Not on file    Highest education level: Not on file   Occupational History    Not on file   Tobacco Use    Smoking status: Never    Smokeless tobacco: Never   Substance and Sexual Activity    Alcohol use: Not Currently     Comment: once or twice a year.    Drug use: Never    Sexual activity: Not on file   Other Topics Concern    Not on file   Social History Narrative    Not on file     Social Drivers of Health     Financial Resource Strain: Not on file   Food Insecurity: Not on file   Transportation Needs: Not on file   Physical Activity: Not on file   Stress: Not on file   Social Connections: Not on file   Intimate Partner Violence: Not on file   Housing  Stability: Not on file        Current Outpatient Medications   Medication Instructions    VITAMIN D PO Take by mouth       Allergies as of 12/11/2023 - never reviewed   Allergen Reaction Noted    Naproxen Anaphylaxis 03/08/2022    Nsaids Anaphylaxis 12/11/2023       Patient Active Problem List   Diagnosis    Depressive disorder    Vitamin D deficiency, unspecified    Endometriosis    Chronic rhinitis    Iliotibial band syndrome    Other ovarian cyst, left side    Backache    Pain in right knee       No past medical history on file.    No past surgical history on file.    No family history on file.      Objective:     Vitals:    Weight BMI   Wt Readings from Last 3 Encounters:   12/11/23 69.4 kg (153 lb)    Body mass index is 27.1 kg/m.     BP HR SaO2   BP Readings from Last 3 Encounters:   12/11/23 124/81  Pulse Readings from Last 3 Encounters:   12/11/23 90    SpO2 Readings from Last 3 Encounters:   12/11/23 99%        Physical Exam  Constitutional:       Appearance: Normal appearance.   HENT:      Head: Normocephalic and atraumatic.   Eyes:      Extraocular Movements: Extraocular movements intact.      Conjunctiva/sclera: Conjunctivae normal.      Pupils: Pupils are equal, round, and reactive to light.   Cardiovascular:      Rate and Rhythm: Normal rate.   Pulmonary:      Effort: Pulmonary effort is normal.   Neurological:      General: No focal deficit present.      Mental Status: She is alert and oriented to person, place, and time.   Psychiatric:         Mood and Affect: Mood normal.         Behavior: Behavior normal.         The mandibular molar Class :   [] 1 [x] 2 [] 3        Mallampati I Airway Classification:   [] 1 [] 2 [x] 3 [] 4      Data reviewed:  HEMOGLOBIN A1C(POST11/14/90) HEMOGLOBIN A1C/HEMOGLOBIN.TOTAL IN BLOOD 5.0 4.2 - 5.9 11/14/2023       f Range & Units 03/15/22 1414   HGB  11.7 - 15.5 g/dL 7.3 Low    HCT  64.8 - 46.5 % 22.9 Low        WBC Normal 6.1 k/cmm 4.5 - 11 Final    11/14/2023 [facility]     RBC Low 4.19 M/cmm 4.2 - 5.4 Final   Low 11/14/2023     HGB Low 11.4 g/dL 12 - 16 Final   Low 94/69/7974     HCT Low 35.0 % 37 - 47 Final   Low 11/14/2023     MCV Normal 83.5 um3 80 - 100 Final    11/14/2023     MCH Normal 27.2 pg 27 - 31 Final    11/14/2023     MCHC Normal 32.6 g/dL 32 - 36 Final    94/69/7974     PLT Normal 286 k/cmm 140 - 420 Final    11/14/2023     MPV High 10.8 um3 7.4 - 10.4 Final   High 11/14/2023     RDW SD Normal 42.0 fL 37.19 - 49.51 Final    11/14/2023     LY % Normal                Historical Sleep Testing Data: None    This note was dictated utilizing voice recognition software which may lead to typographical errors.  I apologize in advance if the situation occurs.  If questions arise please do not hesitate to contact me or call our department.    Electronically signed by Nat Legions, DO on6/26/2025 at 11:22 AM

## 2023-12-11 NOTE — Assessment & Plan Note (Signed)
 Chronic, not at goal (unstable), continue current treatment plan, H/H on 11/14/23 was 11.4 and 35 respectively, unclear how long this has been an issue and why she isn't taking iron. Certainly can contribute to daytime fatigue

## 2023-12-11 NOTE — Assessment & Plan Note (Addendum)
 Unclear history of this, currently not taking medications.  Obviously could be a significant factor in sleep disruption

## 2023-12-11 NOTE — Assessment & Plan Note (Signed)
 Monitored by specialist- no acute findings meriting change in the plan

## 2023-12-11 NOTE — Progress Notes (Signed)
 Madeline Murray presents today for   Chief Complaint   Patient presents with    Snoring    Sleep Problem     Waking up multiple times during the night.        Is someone accompanying this pt? no    Is the patient using any DME equipment during OV? no    -DME Company: N/A    Have you ever had a sleep study done before? no    Depression Screening:      12/11/2023    10:04 AM   PHQ-9    Little interest or pleasure in doing things 0   Feeling down, depressed, or hopeless 1   PHQ-2 Score 1   PHQ-9 Total Score 1        Epworth Sleepiness Scale:      12/11/2023    10:08 AM   Sleep Medicine   Sitting and reading 0   Watching TV 1   Sitting, inactive in a public place (e.g. a theatre or a meeting) 0   As a passenger in a car for an hour without a break 0   Lying down to rest in the afternoon when circumstances permit 3   Sitting and talking to someone 0   Sitting quietly after a lunch without alcohol 3   In a car, while stopped for a few minutes in traffic 0   Epworth Sleepiness Score 7   Neck (Inches) 11.5       Stop-Bang:      12/11/2023    10:00 AM   STOP-BANG QUESTIONNAIRE   Are you a loud and/or regular snorer? 1   Do you often feel tired or groggy upon awakening or do you awaken with a headache? 1   Have you been observed to gasp or stop breathing during sleep? 1   Are you often tired or fatigued during wake time hours?  0   Do you fall asleep sitting, reading, watching TV or driving? 0   Do you often have problems with memory or concentration? 0   Do you have or are you being treated for high blood pressure? 0   Recent BMI (Calculated) 0   Age older than 33 years old? 0=No   Is your neck circumference greater than 17 inches (Female) or 16 inches (Female)? 0   Gender - Female 0=No         Coordination of Care:  1. Have you been to the ER, urgent care clinic since your last visit? Hospitalized since your last visit? no    2. Have you seen or consulted any other health care providers outside of the [facility]  since your last visit? Include any pap smears or colon screening. no    Medication list has been updated according to patient.

## 2024-03-03 DIAGNOSIS — R29818 Other symptoms and signs involving the nervous system: Principal | ICD-10-CM

## 2024-03-03 NOTE — Progress Notes (Signed)
 Patient arrived for sleep study.  Physicians orders were reviewed.  Patient education to include initiation of PAP Therapy per protocol.  Patient questions were answered.  The patient demonstrated good understanding the positive airway pressure device.

## 2024-03-04 ENCOUNTER — Inpatient Hospital Stay: Admit: 2024-03-04 | Discharge: 2024-03-04 | Payer: 59 | Attending: Otolaryngology | Primary: Internal Medicine

## 2024-03-04 NOTE — Progress Notes (Signed)
 Sleep study completed per physician order.  Patient discharged from sleep order.  Patient awake, alert and able to leave the facility under their own power.  Instructed to follow up with their sleep physician for results.

## 2024-04-23 ENCOUNTER — Ambulatory Visit: Admit: 2024-04-23 | Discharge: 2024-04-23 | Payer: 59 | Attending: Otolaryngology | Primary: Internal Medicine

## 2024-04-23 DIAGNOSIS — G4733 Obstructive sleep apnea (adult) (pediatric): Principal | ICD-10-CM

## 2024-04-23 NOTE — Patient Instructions (Signed)
"  Please make a follow up appointment to discuss the results of your sleep study or I will send you a my chart message with your results and treatment options.     The [facility] is located in the [facility], adjacent to [facility]. The lab is on the second floor. The direct number to call for sleep study related questions is: [phone].    Please call our clinic back at [phone] or send a message on MyChart if you have any questions or concerns or if you are experiencing any of the following:     You have not received a follow up appointment within 30 days prior the recommended follow up time.    If you are not tolerating treatment plan and/or not able to obtain equipment or prescribed medication(s).  if you are experiencing any difficulties with the Durable Medical Equipment  (DME) Company you may be using or is assigned to you.  Two weeks have passed and you have not received an appointment for a scheduled procedure.  Two weeks have passed since you underwent a test and/or procedure and you have not received your results.  Your  Durable Medical Equipment (DME ) company is supposed to provide you with replacement filters, tubing and masks. You can either call your DME when you need new supplies or you can arrange for an automatic shipment schedule.    Your need to be seen by our office at lat minimum of every 12 months in order to renew the prescription for these supplies.   Please make note of who your DME company is and their phone number.   Please make sure that you clean your mask and hosing on a regular basis.  Your DME can provide you with additional information regarding proper care and cleaning of your device    Driver Safety is strongly encouraged.  You should not drive if sleepy, tired, distracted and/or fatigued.      Chest Xrays and blood work do not require appointments.  They are considered Walk-In services and can be obtained at either [facility] or [facility].  Blood work is performed at the Laboratory (Lab) from 08:00am - 04:00 pm (if applicable to your visit)   "

## 2024-04-23 NOTE — Assessment & Plan Note (Addendum)
"   Mild at best, clearly not very symptomatic or symptomatic at all based on her PSG with almost no snoring and a sleep efficiency of 91%.    We discussed treatment options and we both agree that treatment is optional and likely not indicated at this period of time.  "

## 2024-04-23 NOTE — Assessment & Plan Note (Signed)
"   Apparent history of currently not taking medications  "

## 2024-04-23 NOTE — Progress Notes (Signed)
 Electrical Engineer   Sleep Medicine     Office Progress Note         [address]  [address]  [place] [address]  [phone]  [phone] Fax    Reason for visit/referral:  f/u PSG  Assessment:      1. OSA (obstructive sleep apnea)  Overview:  PSG 03/03/24  AHI 6.1  O2 nadir 90%  Sleep latency 4 min  TST 7.1 hours  PLMI 17.5  PLMAI 0  Mean HR 71 bpm  Assessment & Plan:   Mild at best, clearly not very symptomatic or symptomatic at all based on her PSG with almost no snoring and a sleep efficiency of 91%.    We discussed treatment options and we both agree that treatment is optional and likely not indicated at this period of time.  2. Hypersomnia  3. Endometriosis  Assessment & Plan:   Monitored by specialist- no acute findings meriting change in the plan  4. Depressive disorder  Assessment & Plan:   Apparent history of currently not taking medications  5. Anemia, unspecified type     Last seen in office on 12/11/23.    We went over the results of the patient's PSG today.  She does have mild obstructive sleep apnea with an apnea hypopnea index of 6.0.  She also fell asleep fairly quickly, 4 minutes and sleep efficiency was very high.  Periodic limb movements of sleep were mildly elevated at 17.5 but no associated arousal index.    Assessment & Plan  1. Mild obstructive sleep apnea.  The patient's condition is not severe, with an average of 6 events per hour. She reports better sleep quality in the sleep lab compared to home, likely due to a more controlled environment. Treatment is optional given the mild nature of the condition. She is advised to consider lifestyle modifications to improve sleep quality, such as maintaining a consistent sleep schedule and creating a relaxing bedtime routine.    2. Periodic limb movements of sleep.  This incidental finding may be related to her anemia. It does not significantly disrupt her sleep. She is advised to continue monitoring her symptoms and  discuss any concerns with her internal medicine doctor.    3. Anemia.  She reports being anemic and experiencing issues with iron absorption despite taking supplements. She is advised to continue working with her internal medicine doctor to manage her anemia, which may include considering intravenous iron if oral supplements remain ineffective.      History of Present Illness  The patient is a 33 year old female here for follow-up for her polysomnography (PSG).    She reports an overall satisfactory experience during her sleep study, noting that she achieved better sleep quality in the lab compared to her home environment. She attributes this improvement to the perceived safety and lack of household responsibilities in the lab setting.     She has been utilizing a sleep ap and believes that relaxation is key to improving her sleep quality.    She acknowledges her anemia diagnosis and is currently under the care of an internal medicine specialist. Despite being on iron supplements, she reports poor absorption.    Supplemental Information  She has undergone multiple surgeries for endometriosis, which has spread to her thoracic region, back, shoulder, and leg. The surgeries provided only temporary relief.    MEDICATIONS  Current: Iron supplements      Prior discussion:  Madeline Murray is a 33 year old female with a history of endometriosis, vitamin D deficiency, anxiety and depression.     It is unclear how long she has had trouble sleeping but part of this may be delayed sleep phase disorder.  She did say that if she goes to bed between 1 and 2 AM she has much less trouble falling asleep.    Recently, her sister and mother came to visit and the patient was told she snores.  She also may have pauses in her breathing or shallow breathing but it was difficult to determine.    She has bother during the day by her severe endometriosis and is seen for that and has had multiple surgeries.  It is unclear if this disrupts  her sleep at this point.    We discussed getting a PSG to determine if she has another primary sleep disorder that is interfering with her sleep.    I have discussed the nature and definition of obstructive sleep apnea and why it would be important to evaluate for this.  We did discuss how undiagnosed/untreated obstructive sleep apnea can affect other medical conditions/disorders, and in particular, cardiovascular disorders.  The consequences of untreated obstructive sleep apnea include the increased risk of stroke, heart disease, hypertension, cognitive difficulties (attention, memory), possible endocrine difficulties to include insulin resistance and possible increased risk of diabetes, increased sleepiness and fatigue and increased risk of motor vehicle accidents.    We discussed the different kinds of sleep studies and I believe a PSG is a reasonable option in the setting    We did discuss not driving sleepy and how weight gain/weight loss can affect obstructive sleep apnea.    Patient asked appropriate questions and would like to move forward with obtaining desired study.      Plan:     Continue working on sleep hygiene, follow-up as needed    Prior plan:  I will order a PSG and have her follow-up in the office to go over the results.    This information was analyzed to assess complexity and medical decision making in regards to further testing and management.      No orders of the defined types were placed in this encounter.       Potential consequences of untreated sleep apnea, and/or excessive daytime sleepiness were discussed with the patient.  Educational materials provided.  Treatment options including CPAP, dental appliance, weight reduction measures, positional therapy, surgeries etc were at least briefly discussed.  Healthy lifestyle changes to include weight loss and exercise discussed.  Healthy sleep habits were reviewed and encouraged.  Driver and workplace safety reviewed and discussed as  appropriate. Drowsy and/or inattentive driving should be avoided.  Follow up with Primary Care Provider (PCP) as directed and for routine health care maintenance.  Follow-up: 1-2 months (after sleep study complete), sooner should new symptoms or problems arise.  Thank you for allowing me to participate in the care of your patient!  Subjective:     Patient ID: Madeline Murray is a 33 y.o. female.    Chief Complaint   Patient presents with    Follow-up    Results     HPI:      Madeline Murray is a 33 y.o. female referred by Gershon Prime, MD for a sleep evaluation. She complains of:     The patient has a long winding down.  At night which involved watching TV, writing out plans for the next day, possibly taking  a shower excetra.  She gets into bed between 10 and 11 PM and it will take her a few hours to fall asleep.  She denies using her phone or watching TV during that.    She admitted that if she went to bed between 1 or 2 AM, she would fall asleep quickly.  She wakes up 3-4 times throughout the night and may get up 0-1 time to use the restroom.  She has difficulty getting back to sleep and sometimes does not go back to sleep at all.  Her alarm is set for 630 to 7 AM but she is usually awake before this.    She has been through sleep hygiene or CBT-I already and was told to get out of bed if she cannot fall asleep within 20 or 30 minutes when she initially gets into bed.    Lately she has been told she snores and there may be some breathing issue at night.  It is unclear if she wakes up choking or gasping for air or has pauses in her breathing.    She is fatigued throughout the day which is her main complaint.    She does nap during the day, will sleep for 20 minutes and then wake up and then feel sleepy again and go back to sleep.  It is unclear actually how much sleep she gets during the day or at night but she thinks she is only getting 2 to 3 hours of sleep at night.    She has a history of severe  endometriosis which bothers her during the day significantly.    Previous sleep evaluation and treatment has included- None    DOT/CDL - No  FAA/Pilot's license -No    Previous Report(s) Reviewed: historical medical records, office notes, and referral letter(s). Pertinent data has been documented.    Sleep symptoms:  Yes No  Additional Comments   Snoring [x]  []     Witness Apneas [x]  []  Possibly   Waking snorting/gasping []  []  Unclear   Nocturia []     [x]     Legs kicking at night []  [x]     AM Headaches []  [x]     Non-restorative sleep [x]  []     Daytime sleepiness/fatigue [x]  []     Daytime napping [x]  []     Drowsiness while driving []  []     Memory issues []  []  ?   Decreased concentration []  [x]     Cataplexy []  [x]     Hypnogogic hallucinations []  [x]     Sleep paralysis []  [x]     Bruxism []  [x]     Clock Watching [x]  []     Other []  []             04/23/2024    11:22 AM 12/11/2023    10:08 AM   Sleep Questionnaire   Sitting and reading 1 0   Watching TV 1 1   Sitting, inactive in a public place (e.g. a theatre or a meeting) 0 0   As a passenger in a car for an hour without a break 0 0   Lying down to rest in the afternoon when circumstances permit 3 3   Sitting and talking to someone 0 0   Sitting quietly after a lunch without alcohol 2 3   In a car, while stopped for a few minutes in traffic 0 0   Epworth Sleepiness Score 7 7       Occupation/work hours: Unemployed    Caffeine Intake - None.    Social  History     Socioeconomic History    Marital status: Single     Spouse name: Not on file    Number of children: Not on file    Years of education: Not on file    Highest education level: Not on file   Occupational History    Not on file   Tobacco Use    Smoking status: Never    Smokeless tobacco: Never   Substance and Sexual Activity    Alcohol use: Not Currently     Comment: once or twice a year.    Drug use: Never    Sexual activity: Not on file   Other Topics Concern    Not on file   Social History Narrative    Not on file      Social Drivers of Health     Financial Resource Strain: Not on file   Food Insecurity: Not on file   Transportation Needs: Not on file   Physical Activity: Not on file   Stress: Not on file   Social Connections: Not on file   Intimate Partner Violence: Not on file   Housing Stability: Not on file        Current Outpatient Medications   Medication Instructions    VITAMIN D PO Take by mouth       Allergies as of 04/23/2024 - Fully Reviewed 04/23/2024   Allergen Reaction Noted    Naproxen Anaphylaxis 03/08/2022    Nsaids Anaphylaxis 12/11/2023       Patient Active Problem List   Diagnosis    Depressive disorder    Vitamin D deficiency, unspecified    Endometriosis    Chronic rhinitis    Iliotibial band syndrome    Other ovarian cyst, left side    Backache    Pain in right knee    Anemia    OSA (obstructive sleep apnea)    Hypersomnia    Periodic limb movement disorder       History reviewed. No pertinent past medical history.    History reviewed. No pertinent surgical history.    History reviewed. No pertinent family history.      Objective:     Vitals:    Weight BMI   Wt Readings from Last 3 Encounters:   04/23/24 64.4 kg (142 lb)   03/03/24 67.1 kg (148 lb)   12/11/23 69.4 kg (153 lb)    Body mass index is 24.37 kg/m.     BP HR SaO2   BP Readings from Last 3 Encounters:   04/23/24 138/72   03/04/24 107/71   12/11/23 124/81    Pulse Readings from Last 3 Encounters:   04/23/24 98   12/11/23 90    SpO2 Readings from Last 3 Encounters:   04/23/24 100%   12/11/23 99%        Physical Exam  Constitutional:       Appearance: Normal appearance.   HENT:      Head: Normocephalic and atraumatic.   Eyes:      Extraocular Movements: Extraocular movements intact.      Conjunctiva/sclera: Conjunctivae normal.      Pupils: Pupils are equal, round, and reactive to light.   Cardiovascular:      Rate and Rhythm: Normal rate.   Pulmonary:      Effort: Pulmonary effort is normal.   Neurological:      General: No focal deficit  present.      Mental Status: She is alert  and oriented to person, place, and time.   Psychiatric:         Mood and Affect: Mood normal. Affect is tearful.         Behavior: Behavior normal.         The mandibular molar Class :   [] 1 [x] 2 [] 3  (from initial exam)        Mallampati I Airway Classification:   [] 1 [] 2 [x] 3 [] 4      Data reviewed:  HEMOGLOBIN A1C(POST11/14/90) HEMOGLOBIN A1C/HEMOGLOBIN.TOTAL IN BLOOD 5.0 4.2 - 5.9 11/14/2023       f Range & Units 03/15/22 1414   HGB  11.7 - 15.5 g/dL 7.3 Low    HCT  64.8 - 46.5 % 22.9 Low        WBC Normal 6.1 k/cmm 4.5 - 11 Final    11/14/2023 [facility]     RBC Low 4.19 M/cmm 4.2 - 5.4 Final   Low 11/14/2023     HGB Low 11.4 g/dL 12 - 16 Final   Low 94/69/7974     HCT Low 35.0 % 37 - 47 Final   Low 11/14/2023     MCV Normal 83.5 um3 80 - 100 Final    11/14/2023     MCH Normal 27.2 pg 27 - 31 Final    11/14/2023     MCHC Normal 32.6 g/dL 32 - 36 Final    94/69/7974     PLT Normal 286 k/cmm 140 - 420 Final    11/14/2023     MPV High 10.8 um3 7.4 - 10.4 Final   High 11/14/2023     RDW SD Normal 42.0 fL 37.19 - 49.51 Final    11/14/2023     LY % Normal                Historical Sleep Testing Data:   PSG 03/03/24  AHI 6.1  O2 nadir 90%  Sleep latency 4 min  TST 7.1 hours  PLMI 17.5  PLMAI 0  Mean HR 71 bpm    The patient (or guardian, if applicable) and other individuals in attendance with the patient were advised that Artificial Intelligence will be utilized during this visit to record, process the conversation to generate a clinical note, and support improvement of the AI technology. The patient (or guardian, if applicable) and other individuals in attendance at the appointment consented to the use of AI, including the recording.       This note was dictated utilizing voice recognition software which may lead to typographical errors.  I apologize in advance if the situation occurs.  If questions arise please do not hesitate to contact me or call our  department.    Electronically signed by Nat Legions, DO on11/12/2023 at 11:39 AM   "

## 2024-04-23 NOTE — Assessment & Plan Note (Signed)
"   Monitored by specialist- no acute findings meriting change in the plan         "

## 2024-04-23 NOTE — Progress Notes (Signed)
"  Madeline Murray presents today for   Chief Complaint   Patient presents with    Follow-up    Results       Is someone accompanying this pt? No    Is the patient using any DME equipment during OV? No    -DME Company: N/A    Depression Screening:      04/23/2024    11:22 AM   PHQ-9    Little interest or pleasure in doing things 0   Feeling down, depressed, or hopeless 1   PHQ-2 Score 1   PHQ-9 Total Score 1        Epworth Sleepiness Scale:      04/23/2024    11:22 AM   Sleep Medicine   Sitting and reading 1   Watching TV 1   Sitting, inactive in a public place (e.g. a theatre or a meeting) 0   As a passenger in a car for an hour without a break 0   Lying down to rest in the afternoon when circumstances permit 3   Sitting and talking to someone 0   Sitting quietly after a lunch without alcohol 2   In a car, while stopped for a few minutes in traffic 0   Epworth Sleepiness Score 7       Stop-Bang:      12/11/2023    10:00 AM   STOP-BANG QUESTIONNAIRE   Are you a loud and/or regular snorer? 1   Do you often feel tired or groggy upon awakening or do you awaken with a headache? 1   Have you been observed to gasp or stop breathing during sleep? 1   Are you often tired or fatigued during wake time hours?  0   Do you fall asleep sitting, reading, watching TV or driving? 0   Do you often have problems with memory or concentration? 0   Do you have or are you being treated for high blood pressure? 0   Recent BMI (Calculated) 0   Age older than 33 years old? 0=No   Is your neck circumference greater than 17 inches (Female) or 16 inches (Female)? 0   Gender - Female 0=No           Coordination of Care:  1. Have you been to the ER, urgent care clinic since your last visit? Hospitalized since your last visit?  no      "
